# Patient Record
Sex: Male | Born: 1935 | Race: White | Hispanic: No | Marital: Married | Smoking: Former smoker
Health system: Southern US, Community
[De-identification: ages and names within clinical notes are randomized; demographics above are authoritative.]

## PROBLEM LIST (undated history)

## (undated) DIAGNOSIS — I10 Essential (primary) hypertension: Secondary | ICD-10-CM

## (undated) DIAGNOSIS — F419 Anxiety disorder, unspecified: Secondary | ICD-10-CM

## (undated) DIAGNOSIS — K5792 Diverticulitis of intestine, part unspecified, without perforation or abscess without bleeding: Secondary | ICD-10-CM

## (undated) DIAGNOSIS — M1990 Unspecified osteoarthritis, unspecified site: Secondary | ICD-10-CM

## (undated) DIAGNOSIS — R0602 Shortness of breath: Secondary | ICD-10-CM

## (undated) DIAGNOSIS — I6529 Occlusion and stenosis of unspecified carotid artery: Secondary | ICD-10-CM

## (undated) DIAGNOSIS — C801 Malignant (primary) neoplasm, unspecified: Secondary | ICD-10-CM

## (undated) DIAGNOSIS — I350 Nonrheumatic aortic (valve) stenosis: Secondary | ICD-10-CM

## (undated) DIAGNOSIS — T7840XA Allergy, unspecified, initial encounter: Secondary | ICD-10-CM

## (undated) DIAGNOSIS — Z860100 Personal history of colon polyps, unspecified: Secondary | ICD-10-CM

## (undated) DIAGNOSIS — E785 Hyperlipidemia, unspecified: Secondary | ICD-10-CM

## (undated) DIAGNOSIS — J8410 Pulmonary fibrosis, unspecified: Secondary | ICD-10-CM

## (undated) DIAGNOSIS — K219 Gastro-esophageal reflux disease without esophagitis: Secondary | ICD-10-CM

## (undated) HISTORY — PX: ANOMALOUS PULMONARY VENOUS RETURN REPAIR: SHX255

## (undated) HISTORY — DX: Anxiety disorder, unspecified: F41.9

## (undated) HISTORY — DX: Nonrheumatic aortic (valve) stenosis: I35.0

## (undated) HISTORY — DX: Occlusion and stenosis of unspecified carotid artery: I65.29

## (undated) HISTORY — DX: Hyperlipidemia, unspecified: E78.5

## (undated) HISTORY — DX: Gastro-esophageal reflux disease without esophagitis: K21.9

## (undated) HISTORY — DX: Diverticulitis of intestine, part unspecified, without perforation or abscess without bleeding: K57.92

## (undated) HISTORY — DX: Allergy, unspecified, initial encounter: T78.40XA

## (undated) HISTORY — DX: Pulmonary fibrosis, unspecified: J84.10

## (undated) HISTORY — DX: Essential (primary) hypertension: I10

## (undated) HISTORY — DX: Personal history of colonic polyps: Z86.010

## (undated) HISTORY — PX: CARDIAC CATHETERIZATION: SHX172

## (undated) HISTORY — DX: Unspecified osteoarthritis, unspecified site: M19.90

## (undated) HISTORY — PX: MELANOMA EXCISION: SHX5266

## (undated) HISTORY — DX: Personal history of colon polyps, unspecified: Z86.0100

---

## 1998-05-06 ENCOUNTER — Inpatient Hospital Stay (HOSPITAL_COMMUNITY): Admission: EM | Admit: 1998-05-06 | Discharge: 1998-05-06 | Payer: Self-pay | Admitting: Neurosurgery

## 2005-10-20 ENCOUNTER — Ambulatory Visit: Payer: Self-pay | Admitting: Family Medicine

## 2005-10-21 ENCOUNTER — Ambulatory Visit: Payer: Self-pay | Admitting: Family Medicine

## 2005-12-01 ENCOUNTER — Ambulatory Visit: Payer: Self-pay | Admitting: Family Medicine

## 2006-06-24 ENCOUNTER — Ambulatory Visit: Payer: Self-pay | Admitting: Internal Medicine

## 2006-07-25 ENCOUNTER — Ambulatory Visit: Payer: Self-pay | Admitting: Internal Medicine

## 2006-10-24 ENCOUNTER — Ambulatory Visit: Payer: Self-pay | Admitting: Internal Medicine

## 2006-11-22 ENCOUNTER — Ambulatory Visit: Payer: Self-pay | Admitting: Internal Medicine

## 2006-11-24 ENCOUNTER — Ambulatory Visit: Payer: Self-pay | Admitting: Cardiology

## 2006-12-06 ENCOUNTER — Encounter: Payer: Self-pay | Admitting: Internal Medicine

## 2006-12-06 ENCOUNTER — Ambulatory Visit: Payer: Self-pay | Admitting: Internal Medicine

## 2006-12-06 LAB — CONVERTED CEMR LAB
Basophils Absolute: 0 10*3/uL (ref 0.0–0.1)
Basophils Relative: 0.6 % (ref 0.0–1.0)
Eosinophils Absolute: 0.3 10*3/uL (ref 0.0–0.6)
Eosinophils Relative: 4.5 % (ref 0.0–5.0)
Hemoglobin: 11.8 g/dL — ABNORMAL LOW (ref 13.0–17.0)
MCV: 71 fL — ABNORMAL LOW (ref 78.0–100.0)
Monocytes Absolute: 0.8 10*3/uL — ABNORMAL HIGH (ref 0.2–0.7)
Monocytes Relative: 10.7 % (ref 3.0–11.0)
Platelets: 326 10*3/uL (ref 150–400)
Pro B Natriuretic peptide (BNP): 288 pg/mL — ABNORMAL HIGH (ref 0.0–100.0)
RBC: 5.02 M/uL (ref 4.22–5.81)
Sed Rate: 19 mm/hr (ref 0–20)
WBC: 7.4 10*3/uL (ref 4.5–10.5)

## 2007-01-11 ENCOUNTER — Ambulatory Visit: Payer: Self-pay | Admitting: Internal Medicine

## 2007-01-13 ENCOUNTER — Ambulatory Visit: Payer: Self-pay | Admitting: Family Medicine

## 2007-01-25 ENCOUNTER — Ambulatory Visit: Payer: Self-pay | Admitting: Internal Medicine

## 2007-01-25 LAB — CONVERTED CEMR LAB
Albumin: 3.6 g/dL (ref 3.5–5.2)
Basophils Absolute: 0 10*3/uL (ref 0.0–0.1)
Cholesterol: 128 mg/dL (ref 0–200)
Eosinophils Absolute: 0.3 10*3/uL (ref 0.0–0.6)
GFR calc Af Amer: 143 mL/min
GFR calc non Af Amer: 118 mL/min
Glucose, Bld: 92 mg/dL (ref 70–99)
HCT: 36.2 % — ABNORMAL LOW (ref 39.0–52.0)
Hemoglobin: 12.3 g/dL — ABNORMAL LOW (ref 13.0–17.0)
MCHC: 34.1 g/dL (ref 30.0–36.0)
MCV: 71.6 fL — ABNORMAL LOW (ref 78.0–100.0)
Monocytes Absolute: 0.6 10*3/uL (ref 0.2–0.7)
Monocytes Relative: 10.5 % (ref 3.0–11.0)
Neutro Abs: 3.2 10*3/uL (ref 1.4–7.7)
Neutrophils Relative %: 53.1 % (ref 43.0–77.0)
Phosphorus: 3.8 mg/dL (ref 2.3–4.6)
Potassium: 4.3 meq/L (ref 3.5–5.1)
RDW: 17.4 % — ABNORMAL HIGH (ref 11.5–14.6)
Saturation Ratios: 6.3 % — ABNORMAL LOW (ref 20.0–50.0)
Sodium: 142 meq/L (ref 135–145)
Total CHOL/HDL Ratio: 4.7

## 2007-02-28 ENCOUNTER — Ambulatory Visit: Payer: Self-pay | Admitting: Gastroenterology

## 2007-02-28 LAB — CONVERTED CEMR LAB
IgA: 219 mg/dL (ref 68–378)
Tissue Transglutaminase Ab, IgA: <3 units (ref <5)

## 2007-03-01 ENCOUNTER — Ambulatory Visit: Payer: Self-pay | Admitting: Internal Medicine

## 2007-03-08 ENCOUNTER — Encounter: Payer: Self-pay | Admitting: Gastroenterology

## 2007-03-08 ENCOUNTER — Ambulatory Visit: Payer: Self-pay | Admitting: Gastroenterology

## 2007-03-08 LAB — HM COLONOSCOPY

## 2007-04-06 DIAGNOSIS — J309 Allergic rhinitis, unspecified: Secondary | ICD-10-CM | POA: Insufficient documentation

## 2007-04-06 DIAGNOSIS — I359 Nonrheumatic aortic valve disorder, unspecified: Secondary | ICD-10-CM | POA: Insufficient documentation

## 2007-04-06 DIAGNOSIS — I10 Essential (primary) hypertension: Secondary | ICD-10-CM | POA: Insufficient documentation

## 2007-04-06 DIAGNOSIS — K5730 Diverticulosis of large intestine without perforation or abscess without bleeding: Secondary | ICD-10-CM | POA: Insufficient documentation

## 2007-04-06 DIAGNOSIS — J8410 Pulmonary fibrosis, unspecified: Secondary | ICD-10-CM

## 2007-04-06 DIAGNOSIS — Z860100 Personal history of colon polyps, unspecified: Secondary | ICD-10-CM | POA: Insufficient documentation

## 2007-04-06 DIAGNOSIS — M1990 Unspecified osteoarthritis, unspecified site: Secondary | ICD-10-CM | POA: Insufficient documentation

## 2007-04-06 DIAGNOSIS — M109 Gout, unspecified: Secondary | ICD-10-CM

## 2007-04-08 DIAGNOSIS — E785 Hyperlipidemia, unspecified: Secondary | ICD-10-CM

## 2007-04-08 DIAGNOSIS — K219 Gastro-esophageal reflux disease without esophagitis: Secondary | ICD-10-CM

## 2007-04-27 ENCOUNTER — Ambulatory Visit: Payer: Self-pay | Admitting: Internal Medicine

## 2007-04-27 DIAGNOSIS — D649 Anemia, unspecified: Secondary | ICD-10-CM

## 2007-05-16 ENCOUNTER — Encounter (INDEPENDENT_AMBULATORY_CARE_PROVIDER_SITE_OTHER): Payer: Self-pay | Admitting: *Deleted

## 2007-10-24 ENCOUNTER — Telehealth (INDEPENDENT_AMBULATORY_CARE_PROVIDER_SITE_OTHER): Payer: Self-pay | Admitting: *Deleted

## 2007-11-10 ENCOUNTER — Ambulatory Visit: Payer: Self-pay | Admitting: Internal Medicine

## 2007-11-10 LAB — CONVERTED CEMR LAB
Basophils Relative: 0.3 % (ref 0.0–1.0)
Eosinophils Relative: 4.2 % (ref 0.0–5.0)
Hemoglobin: 13.4 g/dL (ref 13.0–17.0)
Lymphocytes Relative: 30.9 % (ref 12.0–46.0)
Monocytes Absolute: 0.6 10*3/uL (ref 0.2–0.7)
Monocytes Relative: 9.1 % (ref 3.0–11.0)
Neutro Abs: 3.6 10*3/uL (ref 1.4–7.7)
RDW: 19.3 % — ABNORMAL HIGH (ref 11.5–14.6)
WBC: 6.5 10*3/uL (ref 4.5–10.5)

## 2007-11-13 LAB — CONVERTED CEMR LAB
ALT: 15 units/L (ref 0–53)
AST: 17 units/L (ref 0–37)
Alkaline Phosphatase: 43 units/L (ref 39–117)
BUN: 14 mg/dL (ref 6–23)
Basophils Relative: 1 % (ref 0–1)
Bilirubin, Direct: 0.1 mg/dL (ref 0.0–0.3)
Calcium: 8.7 mg/dL (ref 8.4–10.5)
Cholesterol: 155 mg/dL (ref 0–200)
Eosinophils Absolute: 0.4 10*3/uL (ref 0.0–0.7)
Eosinophils Relative: 6 % — ABNORMAL HIGH (ref 0–5)
Glucose, Bld: 76 mg/dL (ref 70–99)
HCT: 41.3 % (ref 39.0–52.0)
Indirect Bilirubin: 0.5 mg/dL (ref 0.0–0.9)
Lymphs Abs: 2.2 10*3/uL (ref 0.7–4.0)
MCHC: 32.4 g/dL (ref 30.0–36.0)
MCV: 85.5 fL (ref 78.0–100.0)
Monocytes Relative: 8 % (ref 3–12)
Platelets: 220 10*3/uL (ref 150–400)
RBC: 4.83 M/uL (ref 4.22–5.81)
Total Protein: 7.3 g/dL (ref 6.0–8.3)
Triglycerides: 298 mg/dL — ABNORMAL HIGH (ref <150)
WBC: 6.1 10*3/uL (ref 4.0–10.5)

## 2007-11-20 ENCOUNTER — Telehealth: Payer: Self-pay | Admitting: Internal Medicine

## 2007-11-24 ENCOUNTER — Ambulatory Visit: Payer: Self-pay | Admitting: Internal Medicine

## 2007-12-06 ENCOUNTER — Encounter: Payer: Self-pay | Admitting: Internal Medicine

## 2008-01-02 ENCOUNTER — Telehealth (INDEPENDENT_AMBULATORY_CARE_PROVIDER_SITE_OTHER): Payer: Self-pay | Admitting: *Deleted

## 2008-01-04 ENCOUNTER — Ambulatory Visit: Payer: Self-pay | Admitting: Internal Medicine

## 2008-02-13 ENCOUNTER — Ambulatory Visit: Payer: Self-pay | Admitting: Internal Medicine

## 2008-03-04 ENCOUNTER — Ambulatory Visit: Payer: Self-pay | Admitting: Internal Medicine

## 2008-03-15 ENCOUNTER — Encounter: Payer: Self-pay | Admitting: Internal Medicine

## 2008-03-18 ENCOUNTER — Telehealth (INDEPENDENT_AMBULATORY_CARE_PROVIDER_SITE_OTHER): Payer: Self-pay | Admitting: *Deleted

## 2008-03-25 ENCOUNTER — Telehealth (INDEPENDENT_AMBULATORY_CARE_PROVIDER_SITE_OTHER): Payer: Self-pay | Admitting: *Deleted

## 2008-06-18 ENCOUNTER — Ambulatory Visit: Payer: Self-pay | Admitting: Internal Medicine

## 2008-06-19 LAB — CONVERTED CEMR LAB
ALT: 20 units/L (ref 0–53)
AST: 23 units/L (ref 0–37)
BUN: 14 mg/dL (ref 6–23)
Calcium: 9.2 mg/dL (ref 8.4–10.5)
Direct LDL: 94.9 mg/dL
Eosinophils Relative: 5.2 % — ABNORMAL HIGH (ref 0.0–5.0)
Glucose, Bld: 86 mg/dL (ref 70–99)
Hemoglobin: 13.3 g/dL (ref 13.0–17.0)
Lymphocytes Relative: 31.5 % (ref 12.0–46.0)
Monocytes Relative: 9.5 % (ref 3.0–12.0)
Neutro Abs: 2.6 10*3/uL (ref 1.4–7.7)
Phosphorus: 4.2 mg/dL (ref 2.3–4.6)
RBC: 4.58 M/uL (ref 4.22–5.81)
RDW: 14.5 % (ref 11.5–14.6)
Sodium: 141 meq/L (ref 135–145)
Total Bilirubin: 0.8 mg/dL (ref 0.3–1.2)
Total CHOL/HDL Ratio: 9.8
Total Protein: 7.5 g/dL (ref 6.0–8.3)
VLDL: 55 mg/dL — ABNORMAL HIGH (ref 0–40)
WBC: 5 10*3/uL (ref 4.5–10.5)

## 2008-06-28 ENCOUNTER — Telehealth: Payer: Self-pay | Admitting: Internal Medicine

## 2008-07-17 ENCOUNTER — Telehealth: Payer: Self-pay | Admitting: Internal Medicine

## 2008-08-23 ENCOUNTER — Ambulatory Visit: Payer: Self-pay | Admitting: Internal Medicine

## 2008-10-22 ENCOUNTER — Telehealth: Payer: Self-pay | Admitting: Internal Medicine

## 2008-10-28 ENCOUNTER — Ambulatory Visit: Payer: Self-pay | Admitting: Internal Medicine

## 2008-10-31 ENCOUNTER — Telehealth: Payer: Self-pay | Admitting: Internal Medicine

## 2008-11-15 ENCOUNTER — Telehealth: Payer: Self-pay | Admitting: Internal Medicine

## 2008-11-26 ENCOUNTER — Telehealth: Payer: Self-pay | Admitting: Internal Medicine

## 2008-12-11 ENCOUNTER — Encounter: Payer: Self-pay | Admitting: Internal Medicine

## 2009-01-08 ENCOUNTER — Telehealth: Payer: Self-pay | Admitting: Internal Medicine

## 2009-01-28 ENCOUNTER — Encounter: Payer: Self-pay | Admitting: Internal Medicine

## 2009-02-12 ENCOUNTER — Telehealth: Payer: Self-pay | Admitting: Internal Medicine

## 2009-02-18 ENCOUNTER — Telehealth: Payer: Self-pay | Admitting: Internal Medicine

## 2009-02-26 ENCOUNTER — Ambulatory Visit: Payer: Self-pay | Admitting: Internal Medicine

## 2009-02-27 LAB — CONVERTED CEMR LAB
ALT: 23 units/L (ref 0–53)
Albumin: 3.7 g/dL (ref 3.5–5.2)
Alkaline Phosphatase: 44 units/L (ref 39–117)
BUN: 14 mg/dL (ref 6–23)
Basophils Absolute: 0 10*3/uL (ref 0.0–0.1)
Bilirubin, Direct: 0.1 mg/dL (ref 0.0–0.3)
CO2: 33 meq/L — ABNORMAL HIGH (ref 19–32)
Chloride: 108 meq/L (ref 96–112)
HCT: 39.2 % (ref 39.0–52.0)
Lymphs Abs: 1.4 10*3/uL (ref 0.7–4.0)
MCV: 84.9 fL (ref 78.0–100.0)
Monocytes Absolute: 0.3 10*3/uL (ref 0.1–1.0)
Platelets: 192 10*3/uL (ref 150.0–400.0)
RDW: 14.8 % — ABNORMAL HIGH (ref 11.5–14.6)
TSH: 1.29 microintl units/mL (ref 0.35–5.50)
Total Protein: 7.5 g/dL (ref 6.0–8.3)

## 2009-03-28 ENCOUNTER — Telehealth: Payer: Self-pay | Admitting: Family Medicine

## 2009-04-29 ENCOUNTER — Telehealth: Payer: Self-pay | Admitting: Internal Medicine

## 2009-07-09 ENCOUNTER — Ambulatory Visit: Payer: Self-pay | Admitting: Internal Medicine

## 2009-07-09 DIAGNOSIS — G589 Mononeuropathy, unspecified: Secondary | ICD-10-CM | POA: Insufficient documentation

## 2009-08-20 ENCOUNTER — Telehealth: Payer: Self-pay | Admitting: Internal Medicine

## 2009-09-23 ENCOUNTER — Telehealth: Payer: Self-pay | Admitting: Internal Medicine

## 2009-10-21 ENCOUNTER — Telehealth: Payer: Self-pay | Admitting: Internal Medicine

## 2009-11-06 ENCOUNTER — Ambulatory Visit: Payer: Self-pay | Admitting: Internal Medicine

## 2009-11-10 ENCOUNTER — Telehealth: Payer: Self-pay | Admitting: Internal Medicine

## 2009-11-10 LAB — CONVERTED CEMR LAB
ALT: 22 units/L (ref 0–53)
Basophils Relative: 0.4 % (ref 0.0–3.0)
Bilirubin, Direct: 0.1 mg/dL (ref 0.0–0.3)
CO2: 32 meq/L (ref 19–32)
Calcium: 8.9 mg/dL (ref 8.4–10.5)
Chloride: 104 meq/L (ref 96–112)
Eosinophils Absolute: 0.3 10*3/uL (ref 0.0–0.7)
Glucose, Bld: 111 mg/dL — ABNORMAL HIGH (ref 70–99)
HCT: 39.2 % (ref 39.0–52.0)
HDL: 21.1 mg/dL — ABNORMAL LOW (ref >39.00)
Hemoglobin: 12.8 g/dL — ABNORMAL LOW (ref 13.0–17.0)
Lymphs Abs: 1.6 10*3/uL (ref 0.7–4.0)
MCHC: 32.6 g/dL (ref 30.0–36.0)
MCV: 87.3 fL (ref 78.0–100.0)
Monocytes Absolute: 0.4 10*3/uL (ref 0.1–1.0)
Neutro Abs: 2.4 10*3/uL (ref 1.4–7.7)
Neutrophils Relative %: 48.8 % (ref 43.0–77.0)
RBC: 4.5 M/uL (ref 4.22–5.81)
Sodium: 141 meq/L (ref 135–145)
Total Bilirubin: 0.8 mg/dL (ref 0.3–1.2)

## 2009-12-10 ENCOUNTER — Ambulatory Visit: Payer: Self-pay | Admitting: Family Medicine

## 2009-12-16 ENCOUNTER — Ambulatory Visit: Payer: Self-pay | Admitting: Internal Medicine

## 2010-01-13 ENCOUNTER — Telehealth: Payer: Self-pay | Admitting: Internal Medicine

## 2010-03-09 ENCOUNTER — Ambulatory Visit: Payer: Self-pay | Admitting: Internal Medicine

## 2010-03-09 ENCOUNTER — Telehealth: Payer: Self-pay | Admitting: Internal Medicine

## 2010-04-02 ENCOUNTER — Telehealth: Payer: Self-pay | Admitting: Internal Medicine

## 2010-04-10 ENCOUNTER — Telehealth: Payer: Self-pay | Admitting: Internal Medicine

## 2010-04-24 ENCOUNTER — Ambulatory Visit: Payer: Self-pay | Admitting: Internal Medicine

## 2010-05-12 ENCOUNTER — Telehealth: Payer: Self-pay | Admitting: Internal Medicine

## 2010-05-13 ENCOUNTER — Encounter: Payer: Self-pay | Admitting: Internal Medicine

## 2010-06-02 ENCOUNTER — Ambulatory Visit: Payer: Self-pay | Admitting: Internal Medicine

## 2010-07-10 ENCOUNTER — Ambulatory Visit: Payer: Self-pay | Admitting: Internal Medicine

## 2010-07-13 LAB — CONVERTED CEMR LAB
ALT: 16 units/L (ref 0–53)
AST: 20 units/L (ref 0–37)
BUN: 13 mg/dL (ref 6–23)
Basophils Absolute: 0 10*3/uL (ref 0.0–0.1)
Calcium: 9.3 mg/dL (ref 8.4–10.5)
Creatinine, Ser: 0.87 mg/dL (ref 0.40–1.50)
Eosinophils Absolute: 0.3 10*3/uL (ref 0.0–0.7)
Eosinophils Relative: 6 % — ABNORMAL HIGH (ref 0–5)
HCT: 37.9 % — ABNORMAL LOW (ref 39.0–52.0)
Hemoglobin: 12.4 g/dL — ABNORMAL LOW (ref 13.0–17.0)
MCHC: 32.7 g/dL (ref 30.0–36.0)
MCV: 83.5 fL (ref 78.0–100.0)
Monocytes Absolute: 0.4 10*3/uL (ref 0.1–1.0)
Platelets: 202 10*3/uL (ref 150–400)
Pro B Natriuretic peptide (BNP): 79.2 pg/mL (ref 0.0–100.0)
RDW: 16.1 % — ABNORMAL HIGH (ref 11.5–15.5)
Sed Rate: 21 mm/hr — ABNORMAL HIGH (ref 0–16)
Total Bilirubin: 0.4 mg/dL (ref 0.3–1.2)

## 2010-07-16 ENCOUNTER — Telehealth (INDEPENDENT_AMBULATORY_CARE_PROVIDER_SITE_OTHER): Payer: Self-pay | Admitting: *Deleted

## 2010-07-20 ENCOUNTER — Ambulatory Visit: Payer: Self-pay | Admitting: Internal Medicine

## 2010-07-20 DIAGNOSIS — J9610 Chronic respiratory failure, unspecified whether with hypoxia or hypercapnia: Secondary | ICD-10-CM | POA: Insufficient documentation

## 2010-08-28 ENCOUNTER — Telehealth: Payer: Self-pay | Admitting: Internal Medicine

## 2010-08-31 ENCOUNTER — Ambulatory Visit: Payer: Self-pay | Admitting: Internal Medicine

## 2010-09-07 ENCOUNTER — Encounter: Payer: Self-pay | Admitting: Internal Medicine

## 2010-09-24 ENCOUNTER — Encounter: Payer: Self-pay | Admitting: Internal Medicine

## 2010-10-15 ENCOUNTER — Telehealth: Payer: Self-pay | Admitting: Internal Medicine

## 2010-11-04 ENCOUNTER — Ambulatory Visit
Admission: RE | Admit: 2010-11-04 | Discharge: 2010-11-04 | Payer: Self-pay | Source: Home / Self Care | Attending: Internal Medicine | Admitting: Internal Medicine

## 2010-11-04 ENCOUNTER — Other Ambulatory Visit: Payer: Self-pay | Admitting: Internal Medicine

## 2010-11-04 LAB — CBC WITH DIFFERENTIAL/PLATELET
Basophils Absolute: 0 10*3/uL (ref 0.0–0.1)
Basophils Relative: 0.4 % (ref 0.0–3.0)
Eosinophils Absolute: 0.3 10*3/uL (ref 0.0–0.7)
Eosinophils Relative: 4.7 % (ref 0.0–5.0)
HCT: 37.1 % — ABNORMAL LOW (ref 39.0–52.0)
Hemoglobin: 12.5 g/dL — ABNORMAL LOW (ref 13.0–17.0)
Lymphocytes Relative: 27.4 % (ref 12.0–46.0)
Lymphs Abs: 1.7 10*3/uL (ref 0.7–4.0)
MCHC: 33.8 g/dL (ref 30.0–36.0)
MCV: 83.9 fl (ref 78.0–100.0)
Monocytes Absolute: 0.4 10*3/uL (ref 0.1–1.0)
Monocytes Relative: 6.8 % (ref 3.0–12.0)
Neutro Abs: 3.9 10*3/uL (ref 1.4–7.7)
Neutrophils Relative %: 60.7 % (ref 43.0–77.0)
Platelets: 264 10*3/uL (ref 150.0–400.0)
RBC: 4.42 Mil/uL (ref 4.22–5.81)
RDW: 15.4 % — ABNORMAL HIGH (ref 11.5–14.6)
WBC: 6.4 10*3/uL (ref 4.5–10.5)

## 2010-11-04 LAB — BASIC METABOLIC PANEL
BUN: 14 mg/dL (ref 6–23)
CO2: 35 mEq/L — ABNORMAL HIGH (ref 19–32)
Calcium: 9.2 mg/dL (ref 8.4–10.5)
Chloride: 101 mEq/L (ref 96–112)
Creatinine, Ser: 0.9 mg/dL (ref 0.4–1.5)
GFR: 83.25 mL/min (ref >60.00)
Glucose, Bld: 91 mg/dL (ref 70–99)
Potassium: 4.7 mEq/L (ref 3.5–5.1)
Sodium: 141 mEq/L (ref 135–145)

## 2010-11-04 LAB — IBC PANEL
Iron: 44 ug/dL (ref 42–165)
Saturation Ratios: 10.6 % — ABNORMAL LOW (ref 20.0–50.0)
Transferrin: 295.2 mg/dL (ref 212.0–360.0)

## 2010-11-09 ENCOUNTER — Telehealth: Payer: Self-pay | Admitting: Internal Medicine

## 2010-11-11 ENCOUNTER — Encounter: Payer: Self-pay | Admitting: Internal Medicine

## 2010-11-12 ENCOUNTER — Ambulatory Visit
Admission: RE | Admit: 2010-11-12 | Discharge: 2010-11-12 | Payer: Self-pay | Source: Home / Self Care | Attending: Internal Medicine | Admitting: Internal Medicine

## 2010-11-12 ENCOUNTER — Other Ambulatory Visit: Payer: Self-pay | Admitting: Internal Medicine

## 2010-11-13 LAB — LIPID PANEL
Cholesterol: 173 mg/dL (ref 0–200)
HDL: 30.2 mg/dL — ABNORMAL LOW (ref >39.00)
Total CHOL/HDL Ratio: 6
Triglycerides: 222 mg/dL — ABNORMAL HIGH (ref 0.0–149.0)
VLDL: 44.4 mg/dL — ABNORMAL HIGH (ref 0.0–40.0)

## 2010-11-13 LAB — CBC WITH DIFFERENTIAL/PLATELET
Basophils Absolute: 0 10*3/uL (ref 0.0–0.1)
Basophils Relative: 0.5 % (ref 0.0–3.0)
Eosinophils Absolute: 0.4 10*3/uL (ref 0.0–0.7)
Eosinophils Relative: 6.9 % — ABNORMAL HIGH (ref 0.0–5.0)
HCT: 38.4 % — ABNORMAL LOW (ref 39.0–52.0)
Hemoglobin: 12.8 g/dL — ABNORMAL LOW (ref 13.0–17.0)
Lymphocytes Relative: 34.7 % (ref 12.0–46.0)
Lymphs Abs: 2.1 10*3/uL (ref 0.7–4.0)
MCHC: 33.3 g/dL (ref 30.0–36.0)
MCV: 83.8 fl (ref 78.0–100.0)
Monocytes Absolute: 0.4 10*3/uL (ref 0.1–1.0)
Monocytes Relative: 6.7 % (ref 3.0–12.0)
Neutro Abs: 3.1 10*3/uL (ref 1.4–7.7)
Neutrophils Relative %: 51.2 % (ref 43.0–77.0)
Platelets: 239 10*3/uL (ref 150.0–400.0)
RBC: 4.59 Mil/uL (ref 4.22–5.81)
RDW: 15.6 % — ABNORMAL HIGH (ref 11.5–14.6)
WBC: 6 10*3/uL (ref 4.5–10.5)

## 2010-11-13 LAB — HEPATIC FUNCTION PANEL
ALT: 15 U/L (ref 0–53)
AST: 21 U/L (ref 0–37)
Albumin: 3.8 g/dL (ref 3.5–5.2)
Alkaline Phosphatase: 53 U/L (ref 39–117)
Bilirubin, Direct: 0.1 mg/dL (ref 0.0–0.3)
Total Bilirubin: 0.5 mg/dL (ref 0.3–1.2)
Total Protein: 8.4 g/dL — ABNORMAL HIGH (ref 6.0–8.3)

## 2010-11-13 LAB — RENAL FUNCTION PANEL
Albumin: 3.8 g/dL (ref 3.5–5.2)
BUN: 13 mg/dL (ref 6–23)
CO2: 35 mEq/L — ABNORMAL HIGH (ref 19–32)
Calcium: 9.1 mg/dL (ref 8.4–10.5)
Chloride: 99 mEq/L (ref 96–112)
Creatinine, Ser: 0.8 mg/dL (ref 0.4–1.5)
GFR: 98.85 mL/min (ref >60.00)
Glucose, Bld: 92 mg/dL (ref 70–99)
Phosphorus: 4.3 mg/dL (ref 2.3–4.6)
Potassium: 4.3 mEq/L (ref 3.5–5.1)
Sodium: 141 mEq/L (ref 135–145)

## 2010-11-13 LAB — LDL CHOLESTEROL, DIRECT: Direct LDL: 121 mg/dL

## 2010-11-17 NOTE — Progress Notes (Signed)
Summary: Etodolac  Phone Note Refill Request Message from:  Fax from Pharmacy on Mar 09, 2010 11:11 AM  Refills Requested: Medication #1:  ETODOLAC 500 MG  TABS take 1 by mouth once daily as needed pain   Supply Requested: 3 months   Notes: With 4 refills [facility]  Fax:   [phone]   Requests 180 with 4 refills.   Method Requested: Electronic Initial call taken by: Delilah Shan CMA Duncan Dull),  Mar 09, 2010 11:12 AM  Follow-up for Phone Call        please check dose If he takes 1 daily, I think #90 is all they will give okay 4 refills Follow-up by: Cindee Salt MD,  Mar 09, 2010 1:44 PM  Additional Follow-up for Phone Call Additional follow up Details #1::        pt has appt today, will ask pt then. Additional Follow-up by: Mervin Hack CMA Duncan Dull),  Mar 09, 2010 3:19 PM

## 2010-11-17 NOTE — Assessment & Plan Note (Signed)
Summary: EVALUATE FOR OXYGEN   Vital Signs:  Patient profile:   77 year old male Weight:      234 pounds O2 Sat:      83 % on Room air Temp:     98.4 degrees F oral Pulse rate:   80 / minute Pulse rhythm:   regular Resp:     20 per minute BP sitting:   140 / 62  (right arm) Cuff size:   regular  Vitals Entered By: Lowella Petties CMA (April 24, 2010 11:30 AM)  O2 Flow:  Room air CC: Evaluate need for oxygen   History of Present Illness: Was in [place] Altitude was 6500 ft Noted sig SOB---was seen there and noted 02 sat of 74% Has had worsening SOB recently  some increase in chronic cough generally dry has sig DOE---has to stop after walking now. Has gotten up to 1.5 miles in mall before trip--now much worse  much better with the oxygen had to send the portable unit back to [place]  Allergies: 1)  ! Penicillin 2)  ! Sulfa  Past History:  Past medical, surgical, family and social histories (including risk factors) reviewed for relevance to current acute and chronic problems.  Past Medical History: Reviewed history from 07/09/2009 and no changes required. Allergic rhinitis Colonic polyps, hx of--tubular adenoma Diverticulosis, colon Gout Hypertension Osteoarthritis--hands, hips Pulmonary fibrosis-----------------------------------------Dr Wert   Hyperlipidemia GERD Aortic stenosis---------------------------------------------Dr Gwen Pounds   Carotid stenosis  Past Surgical History: Reviewed history from 08/23/2008 and no changes required. AVR (BOVINE ) [facility]  03/2004 CARDIAC CATH-- NO SIG. 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 Dad ided @87  lung disease, ?MI Mom died @58  ?valve disease, thyroid 1 sister with breast cancer No HTN DM in Mom, mat GM No prostate or colon cancer Dad had melanoma  Social  History: Reviewed history from 11/24/2007 and no changes required. Marital Status: Married Children: 1 Daughter in [place] Retired Tax inspector organist Former Smoker quit in [phone]  with no respiratory complaint Alcohol use-yes  Review of Systems       appetite was poor until he started on the oxygen weight stable not sleeping well without the oxygen--slept fine while on it no AM headaches  Physical Exam  General:  alert.  NAD Neck:  supple, no masses, and no thyromegaly.   Lungs:  normal respiratory effort, no intercostal retractions, and no accessory muscle use.  Dry crackles up 1/3rd bilaterally Heart:  normal rate, regular rhythm, no murmur, and no gallop.  Occ skips Extremities:  trace edema bilaterally   Impression & Recommendations:  Problem # 1:  PULMONARY FIBROSIS (ICD-515) Assessment Deteriorated  with sig hypoxia now needs continous oxygen at 2 liters per minute 02 sat at rest in stable state is 83% on room air  Orders: DME Referral (DME)  Complete Medication List: 1)  Prilosec 20 Mg Cpdr (Omeprazole) .... Take 1 tablet by mouth two times a day 2)  Zyrtec 10 Mg Tabs (Cetirizine hcl) .... Take 1 tablet by mouth once a day 3)  Adult Aspirin Low Strength 81 Mg Tbdp (Aspirin) .... Take 1 tablet by mouth once a day 4)  Etodolac 500 Mg Tabs (Etodolac) .... Take 1 by mouth once daily 5)  Pravastatin Sodium 40 Mg Tabs (Pravastatin sodium) .... Take one by mouth daily 6)  Colchicine 0.6 Mg Tabs (Colchicine) .Marland KitchenMarland KitchenMarland Kitchen  Take 1 tablet by mouth once daily to prevent gout 7)  Tramadol Hcl 50 Mg Tabs (Tramadol hcl) .... Take 1 tablet by mouth up to three times a day as needed for pain 8)  Bisoprolol-hydrochlorothiazide 5-6.25 Mg Tabs (Bisoprolol-hydrochlorothiazide) .... Take 1/2 tablet by mouth once daily 9)  Fish Oil Concentrate 1000 Mg Caps (Omega-3 fatty acids) .Marland Kitchen.. 1 daily by mouth 10)  Vitamin D3 1000 Unit Tabs (Cholecalciferol) .... Take 1 by mouth  once daily 11)  Vitamin B-12 1000 Mcg Tabs (Cyanocobalamin) .... Take 1 by mouth once daily 12)  Multivitamins Tabs (Multiple vitamin) .... Take 1 tablet by mouth once a day 13)  Lutein Tabs (Lutein tabs) .... Take 1 tablet by mouth once a day 14)  Cvs Vitamin E 400 Unit Caps (Vitamin e) .Marland Kitchen.. 1 daily by mouth 15)  Coq-10 50 Mg Caps (Coenzyme q10) .Marland Kitchen.. 1 daily by mouth  Patient Instructions: 1)  Please keep September 23rd appt 2)  Please set up the home oxygen

## 2010-11-17 NOTE — Progress Notes (Signed)
Summary: TRAMADOL  Phone Note Refill Request Message [id number] on January 13, 2010 9:59 AM  Refills Requested: Medication #1:  TRAMADOL HCL 50 MG TABS take 1 tablet by mouth two times a day E-Scribe Request, should patient be taking two times a day ? or three times a day ? quantity is 90. Please advise.   Method Requested: Electronic Initial call taken by: Mervin Hack CMA Duncan Dull),  January 13, 2010 10:00 AM  Follow-up for Phone Call        I adjusted Okay #90 x 1 Follow-up by: Cindee Salt MD,  January 13, 2010 11:29 AM  Additional Follow-up for Phone Call Additional follow up Details #1::        Rx faxed to pharmacy Additional Follow-up by: DeShannon Smith CMA Duncan Dull),  January 13, 2010 11:33 AM    New/Updated Medications: TRAMADOL HCL 50 MG TABS (TRAMADOL HCL) take 1 tablet by mouth up to three times a day as needed for pain Prescriptions: TRAMADOL HCL 50 MG TABS (TRAMADOL HCL) take 1 tablet by mouth up to three times a day as needed for pain  #90 x 1   Entered by:   Mervin Hack CMA (AAMA)   Authorized by:   Cindee Salt MD   Signed by:   Mervin Hack CMA (AAMA) on 01/13/2010   Method used:   [facility]ronically to        [facility] [phone][facility] (retail)       [address] [place], [place]  [address]       Ph: [phone] or [phone]       Fax: [phone]   RxID:   [id number]

## 2010-11-17 NOTE — Progress Notes (Signed)
Summary: fyi for lab results  Phone Note Call from Patient   Caller: Patient Call For: wert Summary of Call: pt has already received lab results from dr Vassie Moselle  office Initial call taken by: Rickard Patience,  July 16, 2010 11:12 AM  Follow-up for Phone Call        called and spoke with pt.  pt states he received a call from our office regarding lab results.  Pt just wanted [place] to be aware that he already got these results from [name] office.  ( I don't see anywhere in Pt's chart where we called him about test results....Marland Kitchennothing further needed).  Aundra Millet Reynolds LPN  July 16, 2010 11:20 AM

## 2010-11-17 NOTE — Miscellaneous (Signed)
Summary: Oxygen Device Eval form/[facility]  Oxygen Device Eval form/[facility]   Imported By: Sherian Rein 09/16/2010 09:13:32  _____________________________________________________________________  External Attachment:    Type:   Image     Comment:   External Document

## 2010-11-17 NOTE — Progress Notes (Signed)
Summary: CLARITHROMYCIN  Phone Note Refill Request Message from:  Patient on October 21, 2009 4:43 PM  Refills Requested: Medication #1:  CLARITHROMYCIN 500 MG TABS 2 by mouth times one 1 hour before proceedure. pt states he had a growth removed from his head and he used the 2 CLARITHROMYCIN for that procedure, so now he needs a new rx for his dentist to replace the other, also instead of 2 pills he would like 4 just in case he needs another procedure for the growth that was removed. Pt know Dr. Alphonsus Sias will not be in until Monday.   Method Requested: Electronic Initial call taken by: Mervin Hack CMA Duncan Dull),  October 21, 2009 4:45 PM  Follow-up for Phone Call        okay to refill #4 x 0 I am only prescirbing for dentist though--I will not prescribe for another doctor's procedure and if he had infection from a procedure, he really needs to discuss that with the physician doing the procedure Follow-up by: Cindee Salt MD,  October 24, 2009 1:56 PM  Additional Follow-up for Phone Call Additional follow up Details #1::        spoke with patient and when we "denied new to follow" waiting on Dr. to approve, pt went to his dentist and the dentist refilled. I advised pt that I had to get dr. approval, he said he understood and just called his dentist. Mervin Hack CMA Duncan Dull)  October 27, 2009 9:06 AM   Probably better if dentist fills but my concern was if he had a skin infection after some procedure Additional Follow-up by: Cindee Salt MD,  October 27, 2009 1:23 PM    Prescriptions: CLARITHROMYCIN 500 MG TABS (CLARITHROMYCIN) 2 by mouth times one 1 hour before proceedure  #4 x 0   Entered by:   Mervin Hack CMA (AAMA)   Authorized by:   Cindee Salt MD   Signed by:   Mervin Hack CMA (AAMA) on 10/27/2009   Method used:   Electronically to        [facility] [phone][facility] (retail)       [address] [place], [place]  [address]  Ph: [phone] or [phone]       Fax: [phone]   RxID:   [id number]

## 2010-11-17 NOTE — Progress Notes (Signed)
Summary: ? diverticulitis  Phone Note Call from Patient Call back at [phone]   Caller: Patient Call For: Cindee Salt MD Summary of Call: pt thinks problem  with diverticulitis. Pt is having  loose BM with pain in left side (on pain scale of 1- 10 pt said pain is 5), started  late Sat..  No fever and no mucus in BM. Pt is eating toast and drinking liquids. Pt saw Dr. Alphonsus Sias on 11/06/09. [facility] [phone]. Please advise.  Initial call taken by: Lewanda Rife LPN,  November 10, 2009 9:27 AM  Follow-up for Phone Call        okay to try cipro 500mg  two times a day  #20 x 0 If pain worsens, or unable to eat, or sig pain or fever, he needs to be seen Follow-up by: Cindee Salt MD,  November 10, 2009 10:26 AM  Additional Follow-up for Phone Call Additional follow up Details #1::        Rx Called In, Spoke with patient and advised results.  Additional Follow-up by: Mervin Hack CMA Duncan Dull),  November 10, 2009 10:53 AM    New/Updated Medications: CIPROFLOXACIN HCL 500 MG TABS (CIPROFLOXACIN HCL) take 1 by mouth two times a day Prescriptions: CIPROFLOXACIN HCL 500 MG TABS (CIPROFLOXACIN HCL) take 1 by mouth two times a day  #20 x 0   Entered by:   Mervin Hack CMA (AAMA)   Authorized by:   Cindee Salt MD   Signed by:   Mervin Hack CMA (AAMA) on 11/10/2009   Method used:   Electronically to        [facility] [phone]* (retail)       [address] [place], [place]  [address]       Ph: [phone] or [phone]       Fax: [phone]   RxID:   [id number]

## 2010-11-17 NOTE — Assessment & Plan Note (Signed)
Summary: 4 m f/u dlo   Vital Signs:  Patient profile:   77 year old male Weight:      234 pounds O2 Sat:      91 % on Room air Temp:     98.4 degrees F oral Pulse rate:   72 / minute Pulse rhythm:   regular BP sitting:   110 / 60  (left arm) Cuff size:   large  Vitals Entered By: Mervin Hack CMA Duncan Dull) (July 10, 2010 2:10 PM)  O2 Flow:  Room air CC: 4 month follow-up   History of Present Illness: DOing fairly well Daughter and grandchildren had been with them for a while--now in their own place in [place]  Really notices a difference with the oxygen at night sleeps better and longer Not much difference during the day trying to exercise on treadmilll  Stable resp status Off the oxygen to shower and shave---occ spells off during the day (at rest) Still with regular cough--wife relates this (he states like it is mostly throat clearing but wife states it is deper)  No chest pain No dizziness or syncope---except occ mild orthostatic sensation if he gets up quick No palpitations  Still bothered by the neuropathy esp affects him playing the organ with his feet  Occ mild arthritic pain in low back or hands no meds in general  Allergies: 1)  ! Penicillin 2)  ! Sulfa  Past History:  Past medical, surgical, family and social histories (including risk factors) reviewed for relevance to current acute and chronic problems.  Past Medical History: Reviewed history from 06/02/2010 and no changes required. Allergic rhinitis Colonic polyps, hx of--tubular adenoma Diverticulosis, colon Gout Hypertension Osteoarthritis--hands, hips Pulmonary fibrosis-----------------------------------------Dr Wert       - PFT's 01/01/07 VC 56%  DLC0 42% not corrected for anemia     - 02 dep since June 2011 Hyperlipidemia GERD Aortic stenosis---------------------------------------------Dr Gwen Pounds   Carotid stenosis  Past Surgical History: Reviewed history from 08/23/2008 and no  changes required. AVR (BOVINE ) [facility]  03/2004 CARDIAC CATH-- NO SIG. 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 Dad ided @87  lung disease, ?MI Mom died @58  ?valve disease, thyroid 1 sister with breast cancer No HTN DM in Mom, mat GM No prostate or colon cancer Dad had melanoma  Social History: Reviewed history from 11/24/2007 and no changes required. Marital Status: Married Children: 1 Daughter in [place] Retired Tax inspector organist Former Smoker quit in [phone]  with no respiratory complaint Alcohol use-yes  Review of Systems       appetite is good weight fairly stable--down slightly from a while ago  Physical Exam  General:  alert and normal appearance.   Neck:  supple, no masses, no thyromegaly, no carotid bruits, and no cervical lymphadenopathy.   Lungs:  normal respiratory effort, no intercostal retractions, and no accessory muscle Coarse dry bibasilar crackles Heart:  normal rate, regular rhythm, and no gallop.  soft aortic systolic murmur and opening snap at LLSB Pulses:  1+ in feet Extremities:  no sig edema Psych:  normally interactive, good eye contact, not anxious appearing, and not depressed appearing.     Impression & Recommendations:  Problem # 1:  HYPERTENSION (ICD-401.9) Assessment Unchanged  good control no changes needed  His updated medication list for this problem includes:    Bisoprolol-hydrochlorothiazide 5-6.25 Mg Tabs (Bisoprolol-hydrochlorothiazide) .Marland KitchenMarland KitchenMarland KitchenMarland Kitchen  Take 1/2 tablet by mouth once daily  BP today: 110/60 Prior BP: 130/70 (06/02/2010)  Labs Reviewed: K+: 4.3 (11/06/2009) Creat: : 1.0 (11/06/2009)   Chol: 159 (11/06/2009)   HDL: 21.10 (11/06/2009)   LDL: DEL (06/18/2008)   TG: 226.0 (11/06/2009)  Orders: Venipuncture (11914) Specimen Handling  (99000) T-Comprehensive Metabolic Panel (78295-62130) T-CBC w/Diff (86578-46962)  Problem # 2:  PULMONARY FIBROSIS (ICD-515) Assessment: Unchanged  stable status better sleep on oxygen is quite striking  Orders: T-BNP  (B Natriuretic Peptide) (95284-13244) T-Sed Rate (Automated) (01027-25366)  Problem # 3:  AORTIC STENOSIS (ICD-424.1) Assessment: Unchanged no apparent symptoms  His updated medication list for this problem includes:    Bisoprolol-hydrochlorothiazide 5-6.25 Mg Tabs (Bisoprolol-hydrochlorothiazide) .Marland Kitchen... Take 1/2 tablet by mouth once daily    Adult Aspirin Low Strength 81 Mg Tbdp (Aspirin) .Marland Kitchen... Take 1 tablet by mouth once a day  Problem # 4:  OSTEOARTHRITIS (ICD-715.90) Assessment: Unchanged mild and good control  His updated medication list for this problem includes:    Etodolac 500 Mg Tabs (Etodolac) .Marland Kitchen... Take 1 by mouth once daily    Tramadol Hcl 50 Mg Tabs (Tramadol hcl) .Marland Kitchen... Take 1 tablet by mouth up to three times a day as needed for pain    Adult Aspirin Low Strength 81 Mg Tbdp (Aspirin) .Marland Kitchen... Take 1 tablet by mouth once a day  Complete Medication List: 1)  Prilosec 20 Mg Cpdr (Omeprazole) .... Take one 30-60 min before first and last meals of the day 2)  Etodolac 500 Mg Tabs (Etodolac) .... Take 1 by mouth once daily 3)  Pravastatin Sodium 40 Mg Tabs (Pravastatin sodium) .... Take one by mouth daily 4)  Colchicine 0.6 Mg Tabs (Colchicine) .... Take 1 tablet by mouth once daily to prevent gout 5)  Tramadol Hcl 50 Mg Tabs (Tramadol hcl) .... Take 1 tablet by mouth up to three times a day as needed for pain 6)  Bisoprolol-hydrochlorothiazide 5-6.25 Mg Tabs (Bisoprolol-hydrochlorothiazide) .... Take 1/2 tablet by mouth once daily 7)  Adult Aspirin Low Strength 81 Mg Tbdp (Aspirin) .... Take 1 tablet by mouth once a day 8)  Zyrtec 10 Mg Tabs (Cetirizine hcl) .... Take 1 tablet by mouth once a day 9)  Vitamin D3 1000 Unit Tabs (Cholecalciferol) .... Take 1  by mouth once daily 10)  Vitamin B-12 1000 Mcg Tabs (Cyanocobalamin) .... Take 1 by mouth once daily 11)  Multivitamins Tabs (Multiple vitamin) .... Take 1 tablet by mouth once a day 12)  Lutein Tabs (Lutein tabs) .... Take 1 tablet by mouth once a day 13)  Oxygen  .... Wear 2l/min continuously  Other Orders: Flu Vaccine 46yrs + (44034) Admin 1st Vaccine (74259) Admin 1st Vaccine [facility]) [phone])  Patient Instructions: 1)  Please schedule a follow-up appointment in 4 months .   Current Allergies (reviewed today): ! PENICILLIN ! SULFA   Influenza Vaccine    Vaccine Type: Fluvax 3+    Site: left deltoid    Mfr: GlaxoSmithKline    Dose: 0.5 ml    Route: IM    Given by: Mervin Hack CMA (AAMA)    Exp. Date: 04/17/2011    Lot #: [id number]    VIS given: 05/12/10 version given July 10, 2010.  Flu Vaccine Consent Questions    Do you have a history of severe allergic reactions to this vaccine? no    Any prior history of allergic reactions to egg and/or gelatin? no    Do you have a sensitivity to the  preservative Thimersol? no    Do you have a past history of Guillan-Barre Syndrome? no    Do you currently have an acute febrile illness? no    Have you ever had a severe reaction to latex? no    Vaccine information given and explained to patient? yes

## 2010-11-17 NOTE — Assessment & Plan Note (Signed)
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 Penicillin 2)  ! Sulfa  Past History:  Past medical, surgical, family and social histories (including risk factors) reviewed for relevance to current acute and chronic problems.  Past Medical History: Reviewed history from 07/09/2009 and no changes required. Allergic rhinitis Colonic polyps, hx of--tubular adenoma Diverticulosis, colon Gout Hypertension Osteoarthritis--hands, hips Pulmonary fibrosis-----------------------------------------Dr Wert   Hyperlipidemia GERD Aortic stenosis---------------------------------------------Dr Gwen Pounds   Carotid stenosis  Past Surgical History: Reviewed history from 08/23/2008 and no changes required. AVR (BOVINE ) [facility]  03/2004 CARDIAC CATH-- NO SIG. 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 Dad ided @87  lung disease, ?MI Mom died @58  ?valve disease, thyroid 1 sister with  breast cancer No HTN DM in Mom, mat GM No prostate or colon cancer Dad had melanoma  Social History: Reviewed history from 11/24/2007 and no changes required. Marital Status: Married Children: 1 Daughter in [place] Retired Tax inspector organist Former Smoker quit in [phone]  with no respiratory complaint Alcohol use-yes  Review of Systems       sleeps well nocturia x 1 weight is down 4#  Physical Exam  General:  alert and normal appearance.   Neck:  supple, no masses, no thyromegaly, no carotid bruits, and no cervical lymphadenopathy.   Lungs:  normal respiratory effort, no intercostal retractions, and no accessory muscle use.  Bibasilar dry crackles Heart:  normal rate, regular rhythm, and no gallop.   Opening snap LLSB and soft systolic murmur at base Msk:  no joint tenderness and no joint swelling.   Extremities:  no sig edema Psych:  normally interactive, good eye contact, not anxious appearing, and not depressed appearing.     Impression & Recommendations:  Problem # 1:  HYPERTENSION (ICD-401.9) Assessment Unchanged  good control no changes needed  His updated medication list for this problem includes:    Bisoprolol-hydrochlorothiazide 5-6.25 Mg Tabs (Bisoprolol-hydrochlorothiazide) .Marland Kitchen... Take 1/2 tablet by mouth once daily  BP today: 118/60 Prior BP: 120/70 (07/09/2009)  Labs Reviewed: K+: 4.1 (02/26/2009) Creat: : 0.8 (02/26/2009)   Chol: 160 (02/26/2009)   HDL: 22.30 (02/26/2009)   LDL: DEL (06/18/2008)   TG: 247.0 (02/26/2009)  Orders: TLB-Renal Function Panel (80069-RENAL) TLB-CBC Platelet - w/Differential (85025-CBCD) TLB-TSH (Thyroid Stimulating Hormone) (84443-TSH)  Problem # 2:  GOUT (ICD-274.9) Assessment:  Unchanged  doing well will check uric acid if $$ problems with colcrys, can try allopurinol  His updated medication list for this problem includes:    Etodolac 500 Mg Tabs (Etodolac) .Marland Kitchen... Take 1 by mouth once daily  as needed pain    Colchicine 0.6 Mg Tabs (Colchicine) .Marland Kitchen... Take 1 tablet by mouth once daily to prevent gout  Orders: TLB-Uric Acid, Blood (84550-URIC)  Problem # 3:  PULMONARY FIBROSIS (ICD-515) Assessment: Unchanged  stable resp status tramadol for cough  Orders: Prescription Created Electronically [phone])  Problem # 4:  AORTIC STENOSIS (ICD-424.1) Assessment: Unchanged asymptomatic  His updated medication list for this problem includes:    Adult Aspirin Low Strength 81 Mg Tbdp (Aspirin) .Marland Kitchen... Take 1 tablet by mouth once a day    Bisoprolol-hydrochlorothiazide 5-6.25 Mg Tabs (Bisoprolol-hydrochlorothiazide) .Marland Kitchen... Take 1/2 tablet by mouth once daily  Complete Medication List: 1)  Prilosec 20 Mg Cpdr (Omeprazole) .... Take 1 tablet by mouth two times a day 2)  Zyrtec 10 Mg Tabs (Cetirizine hcl) .... Take 1 tablet by mouth once a day 3)  Adult Aspirin Low Strength 81 Mg Tbdp (Aspirin) .... Take 1 tablet by mouth once a day 4)  Multivitamins Tabs (Multiple vitamin) .... Take 1 tablet by mouth once a day 5)  Lutein Tabs (Lutein tabs) .... Take 1 tablet by mouth once a day 6)  Etodolac 500 Mg Tabs (Etodolac) .... Take 1 by mouth once daily as needed pain 7)  Triamcinolone Acetonide 0.1 % Lotn (Triamcinolone acetonide) .... Apply two times a day as needed for itching in the ear 8)  Pravastatin Sodium 40 Mg Tabs (Pravastatin sodium) .... Take one by mouth daily 9)  Fish Oil Concentrate 1000 Mg Caps (Omega-3 fatty acids) .Marland Kitchen.. 1 daily by mouth 10)  Cvs Vitamin E 400 Unit Caps (Vitamin e) .Marland Kitchen.. 1 daily by mouth 11)  Coq-10 50 Mg Caps (Coenzyme q10) .Marland Kitchen.. 1 daily by mouth 12)  Colchicine 0.6 Mg Tabs (Colchicine) .... Take 1 tablet by mouth once daily to prevent gout 13)  Tramadol Hcl 50 Mg Tabs (Tramadol hcl) .... Take 1 tablet by mouth two times a day 14)  Bisoprolol-hydrochlorothiazide 5-6.25 Mg Tabs (Bisoprolol-hydrochlorothiazide) .... Take 1/2 tablet by mouth once daily 15)  Vitamin  D3 1000 Unit Tabs (Cholecalciferol) .... Take 1 by mouth once daily 16)  Clarithromycin 500 Mg Tabs (Clarithromycin) .... 2 by mouth times one 1 hour before proceedure 17)  Vitamin B-12 1000 Mcg Tabs (Cyanocobalamin) .... Take 1 by mouth once daily  Other Orders: TLB-Lipid Panel (80061-LIPID) TLB-Hepatic/Liver Function Pnl (80076-HEPATIC)  Patient Instructions: 1)  Please schedule a follow-up appointment in 4 months .  Prescriptions: TRAMADOL HCL 50 MG TABS (TRAMADOL HCL) take 1 tablet by mouth two times a day  #90 x 1   Entered and Authorized by:   Cindee Salt MD   Signed by:   Cindee Salt MD on 11/06/2009   Method used:   Electronically to        [facility] [phone]* (retail)       [address] [place], [place]  [address]       Ph: [phone] or [phone]       Fax: [phone]   RxID:   [id number]   Current Allergies (reviewed today): ! PENICILLIN ! SULFA

## 2010-11-17 NOTE — Progress Notes (Signed)
Summary: TRAMADOL  Phone Note Refill Request Message from:  [id number] on August 28, 2010 12:47 PM  Refills Requested: Medication #1:  TRAMADOL HCL 50 MG TABS take 1 tablet by mouth up to three times a day as needed for pain   Last Refilled: 07/28/2010 E-Scribe Request , ok to refill?   Method Requested: Electronic Initial call taken by: Mervin Hack CMA Duncan Dull),  August 28, 2010 12:47 PM  Follow-up for Phone Call        okay #90 x 1 Follow-up by: Cindee Salt MD,  August 28, 2010 1:29 PM  Additional Follow-up for Phone Call Additional follow up Details #1::        Rx faxed to pharmacy Additional Follow-up by: DeShannon Smith CMA Duncan Dull),  August 28, 2010 1:39 PM    Prescriptions: TRAMADOL HCL 50 MG TABS (TRAMADOL HCL) take 1 tablet by mouth up to three times a day as needed for pain  #90 Tablet x 1   Entered by:   Mervin Hack CMA (AAMA)   Authorized by:   Cindee Salt MD   Signed by:   Mervin Hack CMA (AAMA) on 08/28/2010   Method used:   Electronically to        [facility] [phone][facility] (retail)       [address] [place], [place]  [address]       Ph: [phone] or [phone]       Fax: [phone]   RxID:   [id number]

## 2010-11-17 NOTE — Assessment & Plan Note (Signed)
Summary: FOLLOW UP WITH URI   Vital Signs:  Patient profile:   77 year old male Weight:      236 pounds O2 Sat:      91 % on Room air Temp:     98.3 degrees F tympanic Pulse rate:   64 / minute Pulse rhythm:   regular Resp:     22 per minute BP sitting:   130 / 80  (left arm) Cuff size:   large  Vitals Entered By: Mervin Hack CMA Duncan Dull) (December 16, 2009 10:44 AM)  O2 Flow:  Room air CC: follow-up visit   History of Present Illness: Has improved somewhat still  with hacking cough--esp at night has to sit up to sleep breathing is better oxygen level is better-- cardiologist checked also No fever done with prednisone today and finished z-pak No SOB now  Due to leave for [facility] tomorrow AM  Saw the cardiologist yesterday---EKG was fine follow up set for 1 year  Allergies: 1)  ! Penicillin 2)  ! Sulfa  Past History:  Past medical, surgical, family and social histories (including risk factors) reviewed for relevance to current acute and chronic problems.  Past Medical History: Reviewed history from 07/09/2009 and no changes required. Allergic rhinitis Colonic polyps, hx of--tubular adenoma Diverticulosis, colon Gout Hypertension Osteoarthritis--hands, hips Pulmonary fibrosis-----------------------------------------Dr Wert   Hyperlipidemia GERD Aortic stenosis---------------------------------------------Dr Gwen Pounds   Carotid stenosis  Past Surgical History: Reviewed history from 08/23/2008 and no changes required. AVR (BOVINE ) [facility]  03/2004 CARDIAC CATH-- NO SIG. 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 Dad ided @87  lung disease, ?MI Mom died @58  ?valve disease, thyroid 1 sister with breast cancer No HTN DM in Mom, mat GM No prostate or colon cancer Dad had melanoma  Social  History: Reviewed history from 11/24/2007 and no changes required. Marital Status: Married Children: 1 Daughter in [place] Retired Tax inspector organist Former Smoker quit in [phone]  with no respiratory complaint Alcohol use-yes  Review of Systems       Eating a little better No vomiting  slight loose stool at first--seems better now  Physical Exam  General:  alert.  NAD Head:  no sinus tenderness Ears:  R ear normal and L ear normal.   Nose:  mild nasal congestion Mouth:  no erythema and no exudates.   Neck:  supple, no masses, and no cervical lymphadenopathy.   Lungs:  normal respiratory effort, no intercostal retractions, and no accessory muscle use.   typical bibasilar crackles   Impression & Recommendations:  Problem # 1:  BRONCHITIS- ACUTE (ICD-466.0) Assessment Improved better now No evidence of secondary pneumonia--though hard to judge from exam due to chronic crackles okay to travel to [place] If he starts having more trouble--esp with purulent sputum, would send Rx for avelox or levaquin  Problem # 2:  PULMONARY FIBROSIS (ICD-515) Assessment: Unchanged no major change prednisone seems to have helped control with no major exacerbation  Complete Medication List: 1)  Prilosec 20 Mg Cpdr (Omeprazole) .... Take 1 tablet by mouth two times a day 2)  Zyrtec 10 Mg Tabs (Cetirizine hcl) .... Take 1 tablet by mouth once a day 3)  Adult Aspirin Low Strength 81 Mg Tbdp (Aspirin) .... Take 1 tablet by mouth once a day 4)  Etodolac 500 Mg Tabs (Etodolac) .... Take 1 by mouth  once daily as needed pain 5)  Pravastatin Sodium 40 Mg Tabs (Pravastatin sodium) .... Take one by mouth daily 6)  Fish Oil Concentrate 1000 Mg Caps (Omega-3 fatty acids) .Marland Kitchen.. 1 daily by mouth 7)  Colchicine 0.6 Mg Tabs (Colchicine) .... Take 1 tablet by mouth once daily to prevent gout 8)  Tramadol Hcl 50 Mg Tabs (Tramadol hcl) .... Take 1 tablet by mouth two times a day 9)   Bisoprolol-hydrochlorothiazide 5-6.25 Mg Tabs (Bisoprolol-hydrochlorothiazide) .... Take 1/2 tablet by mouth once daily 10)  Vitamin D3 1000 Unit Tabs (Cholecalciferol) .... Take 1 by mouth once daily 11)  Clarithromycin 500 Mg Tabs (Clarithromycin) .... 2 by mouth times one 1 hour before proceedure 12)  Vitamin B-12 1000 Mcg Tabs (Cyanocobalamin) .... Take 1 by mouth once daily 13)  Multivitamins Tabs (Multiple vitamin) .... Take 1 tablet by mouth once a day 14)  Lutein Tabs (Lutein tabs) .... Take 1 tablet by mouth once a day 15)  Triamcinolone Acetonide 0.1 % Lotn (Triamcinolone acetonide) .... Apply two times a day as needed for itching in the ear 16)  Cvs Vitamin E 400 Unit Caps (Vitamin e) .Marland Kitchen.. 1 daily by mouth 17)  Coq-10 50 Mg Caps (Coenzyme q10) .Marland Kitchen.. 1 daily by mouth  Patient Instructions: 1)  Please keep regular follow up appt  Current Allergies (reviewed today): ! PENICILLIN ! SULFA

## 2010-11-17 NOTE — Assessment & Plan Note (Signed)
Summary: Pulmnary/ ext ov with walking sats on 4lpm = desat p 3 laps    Primary Provider/Referring Provider:  Alphonsus Sias  CC:  Dyspnea- some better.  History of Present Illness: 55 yowm quit smoking 1960's  with a history of pulmonary fibrosis dating back symptomatically to 2003 associated with the urge to clear his throat but little else in terms of significant symptoms at original pulmoary eval in  11/2006 assoc with cough better on ppi and microcytic anemia  June 02, 2010 never recovered from flu April 2011 and went driving to [place] in June not needing 02 when left but placed on it while there and drove back with it.  cough more than usual but dry. rec no oil based vitamins but still using perservision with soy oil  July 20, 2010 6 wk followup with PFT's.  Pt states that his breathing is some better since last seen.  He states that he still has the same cough- sometimes prod with green sputum.  Avoid oil based vitamins if possible ask your eye doctor for a substitue I will have your reviewed by our study doctor review your chart for eligibility  Wear 02 at all times @ 2lpm Late add:  increase 02 to 4lpm with activity more than just walking around the house  August 31, 2010 ov cc doe some better on new rx for take 4 with activity.  Pt denies any significant sore throat, dysphagia, itching, sneezing,  nasal congestion or excess secretions,  fever, chills, sweats, unintended wt loss, pleuritic or exertional cp, hempoptysis, change in activity tolerance  orthopnea pnd or leg swelling     Current Medications (verified): 1)  Prilosec 20 Mg  Cpdr (Omeprazole) .... Take One 30-60 Min Before First and Last Meals of The Day 2)  Etodolac 500 Mg  Tabs (Etodolac) .... Take 1 By Mouth Once Daily 3)  Pravastatin Sodium 40 Mg Tabs (Pravastatin Sodium) .... Take One By Mouth Daily 4)  Colchicine 0.6 Mg Tabs (Colchicine) .... Take 1 Tablet By Mouth Once Daily To Prevent Gout 5)  Tramadol Hcl 50 Mg Tabs  (Tramadol Hcl) .... Take 1 Tablet By Mouth Up To Three Times A Day As Needed For Pain 6)  Bisoprolol-Hydrochlorothiazide 5-6.25 Mg Tabs (Bisoprolol-Hydrochlorothiazide) .... Take 1/2 Tablet By Mouth Once Daily 7)  Adult Aspirin Low Strength 81 Mg  Tbdp (Aspirin) .... Take 1 Tablet By Mouth Once A Day 8)  Zyrtec 10 Mg  Tabs (Cetirizine Hcl) .... Take 1 Tablet By Mouth Once A Day 9)  Vitamin D3 1000 Unit Tabs (Cholecalciferol) .... Take 1 By Mouth Once Daily 10)  Vitamin B-12 1000 Mcg Tabs (Cyanocobalamin) .... Take 1 By Mouth Once Daily 11)  Multivitamins   Tabs (Multiple Vitamin) .... Take 1 Tablet By Mouth Once A Day 12)  Lutein   Tabs (Lutein Tabs) .... Take 1 Tablet By Mouth Once A Day 13)  Oxygen .... Wear 2l/min 24 H Per Day Increase To 4lpm With Ex  Allergies (verified): 1)  ! Penicillin 2)  ! Sulfa  Past History:  Past Medical History: Allergic rhinitis Colonic polyps, hx of--tubular adenoma Diverticulosis, colon Gout Hypertension Osteoarthritis--hands, hips Pulmonary fibrosis-----------------------------------------Dr Trajon Rosete       - PFT's 01/01/07 VC 56%  DLC0 42% not corrected for anemia     - PFT's 07/20/10 VC 48%  DLC 0 41%      - 02 dep since June 2011     - Review for Perfenidone trial August 31, 2010 >>>  VC below 50%, does not qualify Hyperlipidemia GERD Aortic stenosis---------------------------------------------Dr Gwen Pounds   Carotid stenosis  Vital Signs:  Patient profile:   77 year old male Weight:      239.38 pounds O2 Sat:      91 % on 2 L/min Temp:     97.5 degrees F oral Pulse rate:   75 / minute BP sitting:   100 / 60  (left arm) Cuff size:   large  Vitals Entered By: Vernie Murders (August 31, 2010 11:02 AM)  O2 Flow:  2 L/min  Serial Vital Signs/Assessments:  Comments: 11:30 AM Ambulatory Pulse Oximetry  Resting; HR___77__    02 Sat__94% on 4 liters___  Lap1 (185 feet)   HR__133___   02 Sat__92% on 4 liters___ Lap2 (185 feet)   HR___97__    02 Sat__89% on 4 liters___    Lap3 (185 feet)   HR__91___   02 Sat_____ 84% on 4 liters ___Test Completed without Difficulty _x__Test Stopped due to:   The patient completed 3 laps but did fall to 84% on the last lap...recovered within 2 minutes on 4 liters to 95%.  By: Michel Bickers CMA    Physical Exam  Additional Exam:  In general this is a well-preserved robust healthy-appearing ambulatory white male in no acute distresson 02 wt 234 >236  June 02, 2010 > 235 July 21, 2010 > 239 August 31, 2010  HEENT: nl dentition, turbinates, and orophanx. Nl external ear canals without cough reflex Neck without JVD/Nodes/TM Lungs  bilateral insp  crackles with a few sqeaks and pops on insp as well RRR no s3 or murmur or increase in P2, no edema Abd soft and benign with nl excursion in the supine position. No bruits or organomegaly Ext warm without calf tenderness, cyanosis but very subtle clubbing is present Skin warm and dry without lesions     Impression & Recommendations:  Problem # 1:  PULMONARY FIBROSIS (ICD-515)  DDx for pulmonary fibrosis with honeycombing includes idiopathic pulmonary fibrosis, pulmonary fibrosis associated with rheumatologic disease, adverse effect from  drugs such as chemotherapy or amiodarone exposure, nonspecific interstitial pneumonia which is typically steroid responsive, and chronic hypersensitivity pneumonitis.    Nl ESR and absence of clubbing noted.   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  Will refer chart for review of whether he might qualify for our PF study but really nothing else to offer but conservative f/u  Problem # 2:  RESPIRATORY FAILURE, CHRONIC (ICD-518.83)  02 rx reviewed, needs 2lpm x boost to 4 lpm with activity to prevent desat and learn to pace at slow flat grade  Orders: Pulse Oximetry, Ambulatory (14782)  Other Orders: Est. Patient Level III  (95621)  Patient Instructions: 1)  Wear 02 at all times @ 2lpm 2)  Increase 02 to 4lpm with activity more than just walking around the house 3)  I will let our fibrosis doctor review your chart and get back to you by Dec 1 re whether you qualify for the study 4)  Please schedule a follow-up appointment in 6 weeks, sooner if needed

## 2010-11-17 NOTE — Assessment & Plan Note (Signed)
Summary: 4 m f/u dlo   Vital Signs:  Patient profile:   77 year old male Weight:      234 pounds Temp:     98.8 degrees F oral Pulse rate:   64 / minute Pulse rhythm:   regular BP sitting:   116 / 68  (left arm) Cuff size:   large  Vitals Entered By: Mervin Hack CMA Duncan Dull) (Mar 09, 2010 3:48 PM) CC: 4 month follow-up   History of Present Illness: Still having hacking cough---  "more than he even reallzes" per wife Easy DOE but no sig change. Can walk flat for a mile without stopping can climb stairs if he goes slowly Takes tramadol and feels it helps some Hasn't been back to pulmonologist---appt on as needed basis  Occ heartburn---much better since he is careful with diet ON prilosec daily takes the zyrtec every day also  No chest pain No edema  Gout seems to be quiet Arthritis in hands has not been bad lately  Allergies: 1)  ! Penicillin 2)  ! Sulfa  Past History:  Past medical, surgical, family and social histories (including risk factors) reviewed for relevance to current acute and chronic problems.  Past Medical History: Reviewed history from 07/09/2009 and no changes required. Allergic rhinitis Colonic polyps, hx of--tubular adenoma Diverticulosis, colon Gout Hypertension Osteoarthritis--hands, hips Pulmonary fibrosis-----------------------------------------Dr Wert   Hyperlipidemia GERD Aortic stenosis---------------------------------------------Dr Gwen Pounds   Carotid stenosis  Past Surgical History: Reviewed history from 08/23/2008 and no changes required. AVR (BOVINE ) [facility]  03/2004 CARDIAC CATH-- NO SIG. 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 Dad ided @87  lung disease, ?MI Mom died @58  ?valve disease, thyroid 1 sister with breast cancer No HTN DM in Mom, mat GM No prostate or  colon cancer Dad had melanoma  Social History: Reviewed history from 11/24/2007 and no changes required. Marital Status: Married Children: 1 Daughter in [place] Retired Tax inspector organist Former Smoker quit in [phone]  with no respiratory complaint Alcohol use-yes  Review of Systems       sleeps okay appetite is fine weight fairly stable  Physical Exam  General:  alert and normal appearance.   Neck:  supple, no masses, no thyromegaly, no carotid bruits, and no cervical lymphadenopathy.   Lungs:  normal respiratory effort, no intercostal retractions, and no accessory muscle use.  Loud crackles across all lower lung fields Heart:  normal rate, regular rhythm, no murmur, and no gallop.   Abdomen:  soft and non-tender.   Pulses:  faint in feet Extremities:  no sig edema Psych:  normally interactive, good eye contact, not anxious appearing, and not depressed appearing.     Impression & Recommendations:  Problem # 1:  PULMONARY FIBROSIS (ICD-515) Assessment Unchanged ongoing cough will go back to pulmonary if cough is not improving  Problem # 2:  HYPERTENSION (ICD-401.9) Assessment: Unchanged good control no changes needed  His updated medication list for this problem includes:    Bisoprolol-hydrochlorothiazide 5-6.25 Mg Tabs (Bisoprolol-hydrochlorothiazide) .Marland Kitchen... Take 1/2 tablet by mouth once daily  BP today: 116/68 Prior BP: 130/80 (12/16/2009)  Labs Reviewed: K+: 4.3 (11/06/2009) Creat: : 1.0 (11/06/2009)   Chol: 159 (11/06/2009)   HDL: 21.10 (11/06/2009)   LDL: DEL (06/18/2008)   TG: 226.0 (11/06/2009)  Problem # 3:  GERD (ICD-530.81) Assessment: Unchanged seems to be adequately controlled doesn't seem to  be cause of cough  His updated medication list for this problem includes:    Prilosec 20 Mg Cpdr (Omeprazole) .Marland Kitchen... Take 1 tablet by mouth two times a day  Problem # 4:  GOUT (ICD-274.9) Assessment: Unchanged quiet now  His  updated medication list for this problem includes:    Etodolac 500 Mg Tabs (Etodolac) .Marland Kitchen... Take 1 by mouth once daily    Colchicine 0.6 Mg Tabs (Colchicine) .Marland Kitchen... Take 1 tablet by mouth once daily to prevent gout  Complete Medication List: 1)  Prilosec 20 Mg Cpdr (Omeprazole) .... Take 1 tablet by mouth two times a day 2)  Zyrtec 10 Mg Tabs (Cetirizine hcl) .... Take 1 tablet by mouth once a day 3)  Adult Aspirin Low Strength 81 Mg Tbdp (Aspirin) .... Take 1 tablet by mouth once a day 4)  Etodolac 500 Mg Tabs (Etodolac) .... Take 1 by mouth once daily 5)  Pravastatin Sodium 40 Mg Tabs (Pravastatin sodium) .... Take one by mouth daily 6)  Colchicine 0.6 Mg Tabs (Colchicine) .... Take 1 tablet by mouth once daily to prevent gout 7)  Tramadol Hcl 50 Mg Tabs (Tramadol hcl) .... Take 1 tablet by mouth up to three times a day as needed for pain 8)  Bisoprolol-hydrochlorothiazide 5-6.25 Mg Tabs (Bisoprolol-hydrochlorothiazide) .... Take 1/2 tablet by mouth once daily 9)  Fish Oil Concentrate 1000 Mg Caps (Omega-3 fatty acids) .Marland Kitchen.. 1 daily by mouth 10)  Vitamin D3 1000 Unit Tabs (Cholecalciferol) .... Take 1 by mouth once daily 11)  Vitamin B-12 1000 Mcg Tabs (Cyanocobalamin) .... Take 1 by mouth once daily 12)  Multivitamins Tabs (Multiple vitamin) .... Take 1 tablet by mouth once a day 13)  Lutein Tabs (Lutein tabs) .... Take 1 tablet by mouth once a day 14)  Cvs Vitamin E 400 Unit Caps (Vitamin e) .Marland Kitchen.. 1 daily by mouth 15)  Coq-10 50 Mg Caps (Coenzyme q10) .Marland Kitchen.. 1 daily by mouth  Patient Instructions: 1)  Please schedule a follow-up appointment in 4 months .  Prescriptions: ETODOLAC 500 MG  TABS (ETODOLAC) take 1 by mouth once daily  #90 x 3   Entered by:   Mervin Hack CMA (AAMA)   Authorized by:   Cindee Salt MD   Signed by:   Mervin Hack CMA (AAMA) on 03/09/2010   Method used:   Electronically to        [facility]* (mail-order)             ,          Ph: [phone]        Fax: [phone]   RxID:   [id number]   Current Allergies (reviewed today): ! PENICILLIN ! SULFA

## 2010-11-17 NOTE — Assessment & Plan Note (Signed)
Summary: Pulmonary/ ext ov/ increase to 4lpm with ex   Primary Provider/Referring Provider:  Alphonsus Sias  CC:  75 wk followup with PFT's.  Pt states that his breathing is some better since last seen.  He states that he still has the same cough- sometimes prod with green sputum.  No new complaints today.Marland Kitchen  History of Present Illness: 77 yowm quit smoking 1960's  with a history of pulmonary fibrosis dating back symptomatically to 2003 associated with the urge to clear his throat but little else in terms of significant symptoms at original pulmoary eval in  11/2006 assoc with cough better on ppi and microcytic anemia  June 02, 2010 never recovered from flu April 2011 and went driving to [place] in June not needing 02 when left but placed on it while there and drove back with it.  cough more than usual but dry. rec no oil based vitamins but still using perservision with soy oil  July 20, 2010 6 wk followup with PFT's.  Pt states that his breathing is some better since last seen.  He states that he still has the same cough- sometimes prod with green sputum.  No new complaints today.  Pt denies any significant sore throat, dysphagia, itching, sneezing,  nasal congestion or excess secretions,  fever, chills, sweats, unintended wt loss, pleuritic or exertional cp, hempoptysis, change in activity tolerance  orthopnea pnd or leg swelling Pt also denies any obvious fluctuation in symptoms with weather or environmental change or other alleviating or aggravating factors.       Current Medications (verified): 1)  Prilosec 20 Mg  Cpdr (Omeprazole) .... Take One 30-60 Min Before First and Last Meals of The Day 2)  Etodolac 500 Mg  Tabs (Etodolac) .... Take 1 By Mouth Once Daily 3)  Pravastatin Sodium 40 Mg Tabs (Pravastatin Sodium) .... Take One By Mouth Daily 4)  Colchicine 0.6 Mg Tabs (Colchicine) .... Take 1 Tablet By Mouth Once Daily To Prevent Gout 5)  Tramadol Hcl 50 Mg Tabs (Tramadol Hcl) .... Take 1  Tablet By Mouth Up To Three Times A Day As Needed For Pain 6)  Bisoprolol-Hydrochlorothiazide 5-6.25 Mg Tabs (Bisoprolol-Hydrochlorothiazide) .... Take 1/2 Tablet By Mouth Once Daily 7)  Adult Aspirin Low Strength 81 Mg  Tbdp (Aspirin) .... Take 1 Tablet By Mouth Once A Day 8)  Zyrtec 10 Mg  Tabs (Cetirizine Hcl) .... Take 1 Tablet By Mouth Once A Day 9)  Vitamin D3 1000 Unit Tabs (Cholecalciferol) .... Take 1 By Mouth Once Daily 10)  Vitamin B-12 1000 Mcg Tabs (Cyanocobalamin) .... Take 1 By Mouth Once Daily 11)  Multivitamins   Tabs (Multiple Vitamin) .... Take 1 Tablet By Mouth Once A Day 12)  Lutein   Tabs (Lutein Tabs) .... Take 1 Tablet By Mouth Once A Day 13)  Oxygen .... Wear 2l/min Continuously  Allergies (verified): 1)  ! Penicillin 2)  ! Sulfa  Past History:  Past Medical History: Allergic rhinitis Colonic polyps, hx of--tubular adenoma Diverticulosis, colon Gout Hypertension Osteoarthritis--hands, hips Pulmonary fibrosis-----------------------------------------Dr Wert       - PFT's 01/01/07 VC 56%  DLC0 42% not corrected for anemia     - PFT's 07/20/10 VC 48%  DLC 0 41%      - 02 dep since June 2011 Hyperlipidemia GERD Aortic stenosis---------------------------------------------Dr Gwen Pounds   Carotid stenosis  Vital Signs:  Patient profile:   77 year old male Weight:      235 pounds O2 Sat:  90 % on 2 L/min Temp:     97.9 degrees F oral Pulse rate:   67 / minute BP sitting:   114 / 68  (left arm)  Vitals Entered By: Vernie Murders (July 20, 2010 2:07 PM)  O2 Flow:  2 L/min  Serial Vital Signs/Assessments:  Comments: 2:50 PM Ambulatory Pulse Oximetry  Resting; HR__71___    02 Sat___94% on 2 liters pulsing__  Lap1 (185 feet)   HR__85___   02 Sat__93% on 2 liters pulsing___ Lap2 (185 feet)   HR__90___   02 Sat__88% on 2 liters pulsing, bumped up to 3 liters pulsing and sats did not recover, bumped to 4 liters pulsing and sats recovered to 95%___      Lap3 (185 feet)   HR__85___   02 Sat__94% on 4 liters pulsing___  ___Test Completed without Difficulty _x__Test Stopped due to: had to stop after 2 laps due to drop in sats on 2 liters pulsing...patients sats stayed at 94% on 4 liters pulsing  By: Michel Bickers CMA    Physical Exam  Additional Exam:  In general this is a well-preserved robust healthy-appearing ambulatory white male in no acute distresson 02 wt 234 >236  June 02, 2010 > 235 July 21, 2010  HEENT: nl dentition, turbinates, and orophanx. Nl external ear canals without cough reflex Neck without JVD/Nodes/TM Lungs  bilateral insp  crackles with a few sqeaks and pops on insp as well RRR no s3 or murmur or increase in P2, no edema Abd soft and benign with nl excursion in the supine position. No bruits or organomegaly Ext warm without calf tenderness, cyanosis but very subtle clubbing is present Skin warm and dry without lesions     Impression & Recommendations:  Problem # 1:  PULMONARY FIBROSIS (ICD-515) DDx for pulmonary fibrosis with honeycombing includes idiopathic pulmonary fibrosis, pulmonary fibrosis associated with rheumatologic disease, adverse effect from  drugs such as chemotherapy or amiodarone exposure, nonspecific interstitial pneumonia which is typically steroid responsive, and chronic hypersensitivity pneumonitis.    Nl ESR and absence of clubbing noted.   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  Will refer chart for review of whether he might qualify for our PF study but really nothing else to offer but conservative f/u  Problem # 2:  RESPIRATORY FAILURE, CHRONIC (ICD-518.83)  02 rx reviewed, needs 2lpm x boost to 4 lpm with activity to prevent desat  Medications Added to Medication List This Visit: 1)  Oxygen  .... Wear 2l/min 24 h per day increase to 4lpm with ex  Other Orders: Est. Patient Level IV (99214) Pulse Oximetry,  Ambulatory (16109)  Patient Instructions: 1)  Avoid oil based vitamins if possible ask your eye doctor for a substitue 2)  I will have your reviewed by our study doctor review your chart for eligibility  3)  Please schedule a follow-up appointment in 6 weeks, sooner if needed  4)  Wear 02 at all times @ 2lpm 5)  Late add:  increase 02 to 4lpm with activity more than just walking around the house  Appended Document: Pulmonary/ ext ov/ increase to 4lpm with ex LMOMTCB- need to advise pt of late add recs per MW.  Appended Document: Pulmonary/ ext ov/ increase to 4lpm with ex Spoke with pt and notified od late add recs per MW.  Pt verbalized understanding.

## 2010-11-17 NOTE — Letter (Signed)
Summary: CMN for Oxygen/[facility]  CMN for Oxygen/[facility]   Imported By: Lanelle Bal 05/19/2010 10:29:17  _____________________________________________________________________  External Attachment:    Type:   Image     Comment:   External Document

## 2010-11-17 NOTE — Progress Notes (Signed)
Summary: Rx Tramadol  Phone Note Refill Request Call back at [phone] Message from:  [facility] on May 12, 2010 1:57 PM  Refills Requested: Medication #1:  TRAMADOL HCL 50 MG TABS take 1 tablet by mouth up to three times a day as needed for pain No last refill date sent   Method Requested: Electronic Initial call taken by: Janee Morn CMA,  May 12, 2010 1:57 PM  Follow-up for Phone Call        okay #90 x 0 Follow-up by: Cindee Salt MD,  May 12, 2010 2:12 PM  Additional Follow-up for Phone Call Additional follow up Details #1::        Rx called to pharmacy Additional Follow-up by: Linde Gillis CMA Duncan Dull),  May 12, 2010 2:37 PM    Prescriptions: TRAMADOL HCL 50 MG TABS (TRAMADOL HCL) take 1 tablet by mouth up to three times a day as needed for pain  #90 Tablet x 0   Entered by:   Linde Gillis CMA (AAMA)   Authorized by:   Cindee Salt MD   Signed by:   Linde Gillis CMA (AAMA) on 05/12/2010   Method used:   Telephoned to ...       [facility] [phone]* (retail)       [address] [place], [place]  [address]       Ph: [phone] or [phone]       Fax: [phone]   RxID:   [id number]

## 2010-11-17 NOTE — Assessment & Plan Note (Signed)
Summary: COUGH AND CONGESTION   Vital Signs:  Patient profile:   77 year old male Weight:      235.38 pounds BMI:     32.95 Temp:     98.1 degrees F oral Pulse rate:   66 / minute Pulse rhythm:   regular BP sitting:   100 / 52  (right arm) Cuff size:   large  Vitals Entered By: Linde Gillis CMA Duncan Dull) (December 10, 2009 4:16 PM) CC: cough, congestion   History of Present Illness: 77 year old male:  last night could not really get his breath finally, spent the night in a recliner  strted to get sick some yesterday. Had the sniffles for a couple of days.   Acute Visit History:      The patient complains of cough, headache, and nasal discharge.  These symptoms began 2 days ago.  He denies earache, fever, musculoskeletal symptoms, nausea, sinus problems, and sore throat.  Other comments include: HISTORY OF PULMONARY FIBROSIS.        The patient notes wheezing.  The cough interferes with his sleep.  The character of the cough is described as productive.  He has a history of COPD.  There is no history of shortness of breath, respiratory retractions, tachypnea, cyanosis, or interference with oral intake associated with his cough.        Urine output has been normal.  He is tolerating clear liquids.        Allergies: 1)  ! Penicillin 2)  ! Sulfa  Past History:  Past medical, surgical, family and social histories (including risk factors) reviewed, and no changes noted (except as noted below).  Past Medical History: Reviewed history from 07/09/2009 and no changes required. Allergic rhinitis Colonic polyps, hx of--tubular adenoma Diverticulosis, colon Gout Hypertension Osteoarthritis--hands, hips Pulmonary fibrosis-----------------------------------------Dr Wert   Hyperlipidemia GERD Aortic stenosis---------------------------------------------Dr Gwen Pounds   Carotid stenosis  Past Surgical History: Reviewed history from 08/23/2008 and no changes required. AVR (BOVINE )  [facility]  03/2004 CARDIAC CATH-- NO SIG. 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 Dad ided @87  lung disease, ?MI Mom died @58  ?valve disease, thyroid 1 sister with breast cancer No HTN DM in Mom, mat GM No prostate or colon cancer Dad had melanoma  Social History: Reviewed history from 11/24/2007 and no changes required. Marital Status: Married Children: 1 Daughter in [place] Retired Tax inspector organist Former Smoker quit in [phone]  with no respiratory complaint Alcohol use-yes  Review of Systems       REVIEW OF SYSTEMS GEN: Acute illness details above. CV: No chest pain GI: No noted N or V Otherwise, pertinent positives and negatives are noted in the HPI.   Physical Exam  General:  Well-developed,well-nourished,in no acute distress; alert,appropriate and cooperative throughout examination Head:  normocephalic and atraumatic.   Ears:  External ear exam shows no significant lesions or deformities.  Otoscopic examination reveals clear canals, tympanic membranes are intact bilaterally without bulging, retraction, inflammation or discharge. Hearing is grossly normal bilaterally. Nose:  no external deformity.   Mouth:  Oral mucosa and oropharynx without lesions or exudates.  Teeth in good repair. Neck:  No deformities, masses, or tenderness noted. Lungs:  normal respiratory effort, no intercostal retractions, and no accessory muscle use.  Bibasilar dry crackles. scattered wheezes Heart:  normal rate, regular rhythm, and no gallop.  soft systolic murmur at base Extremities:  no edema Neurologic:  alert & oriented X3 and gait normal.   Psych:  Cognition and judgment appear intact. Alert and cooperative with normal attention span and concentration. No apparent delusions, illusions,  hallucinations   Impression & Recommendations:  Problem # 1:  BRONCHITIS- ACUTE (ICD-466.0) Assessment Deteriorated Acutely wheezing with active infection with pulmonary fibrosis  Prednisone burst x 1 week and cover with ABX  His updated medication list for this pr oblem includes:    Clarithromycin 500 Mg Tabs (Clarithromycin) .Marland Kitchen... 2 by mouth times one 1 hour before proceedure    Ciprofloxacin Hcl 500 Mg Tabs (Ciprofloxacin hcl) .Marland Kitchen... Take 1 by mouth two times a day    Azithromycin 250 Mg Tabs (Azithromycin) .Marland Kitchen... 2 by  mouth today and then 1 daily for 4 days  Problem # 2:  PULMONARY FIBROSIS (ICD-515) Assessment: Deteriorated Prednisone 40 mg x 7 days  Complete Medication List: 1)  Prilosec 20 Mg Cpdr (Omeprazole) .... Take 1 tablet by mouth two times a day 2)  Zyrtec 10 Mg Tabs (Cetirizine hcl) .... Take 1 tablet by mouth once a day 3)  Adult Aspirin Low Strength 81 Mg Tbdp (Aspirin) .... Take 1 tablet by mouth once a day 4)  Multivitamins Tabs (Multiple vitamin) .... Take 1 tablet by mouth once a day 5)  Lutein Tabs (Lutein tabs) .... Take 1 tablet by mouth once a day 6)  Etodolac 500 Mg Tabs (Etodolac) .... Take 1 by mouth once daily as needed pain 7)  Triamcinolone Acetonide 0.1 % Lotn (Triamcinolone acetonide) .... Apply two times a day as needed for itching in the ear 8)  Pravastatin Sodium 40 Mg Tabs (Pravastatin sodium) .... Take one by mouth daily 9)  Fish Oil Concentrate 1000 Mg Caps (Omega-3 fatty acids) .Marland Kitchen.. 1 daily by mouth 10)  Cvs Vitamin E 400 Unit Caps (Vitamin e) .Marland Kitchen.. 1 daily by mouth 11)  Coq-10 50 Mg Caps (Coenzyme q10) .Marland Kitchen.. 1 daily by mouth 12)  Colchicine 0.6 Mg Tabs (Colchicine) .... Take 1 tablet by mouth once daily to prevent gout 13)  Tramadol Hcl 50 Mg Tabs (Tramadol hcl) .... Take 1 tablet by mouth two times a day 14)  Bisoprolol-hydrochlorothiazide 5-6.25 Mg Tabs (Bisoprolol-hydrochlorothiazide) .... Take 1/2 tablet by mouth once daily 15)  Vitamin  D3 1000 Unit Tabs (Cholecalciferol) .... Take 1 by mouth once daily 16)  Clarithromycin 500 Mg Tabs (Clarithromycin) .... 2 by mouth times one 1 hour before proceedure 17)  Vitamin B-12 1000 Mcg Tabs (Cyanocobalamin) .... Take 1 by mouth once daily 18)  Azithromycin 250 Mg Tabs (Azithromycin) .... 2 by  mouth today and then 1 daily for 4 days 19)  Prednisone 20 Mg Tabs (Prednisone) .... 2 tabs by mouth daily Prescriptions: PREDNISONE 20 MG TABS (PREDNISONE) 2 tabs by mouth daily  #14 x 0   Entered and Authorized by:   Hannah Beat MD   Signed by:   Hannah Beat MD on 12/10/2009   Method used:   [facility]        [facility] [phone][facility] (retail)       [address] [place], [place]  [address]       Ph: [phone] or [phone]       Fax: [phone]   RxID:   [id number] AZITHROMYCIN 250 MG  TABS (AZITHROMYCIN) 2 by  mouth today and  then 1 daily for 4 days  #6 x 0   Entered and Authorized by:   Hannah Beat MD   Signed by:   Hannah Beat MD on 12/10/2009   Method used:   [facility]ronically to        [facility] [phone]* (retail)       [address] [place], [place]  [address]       Ph: [phone] or [phone]       Fax: [phone]   RxID:   [id number]   Current Allergies (reviewed today): ! PENICILLIN ! SULFA

## 2010-11-17 NOTE — Progress Notes (Signed)
Summary: problems breathing  Phone Note Call from Patient   Caller: Patient Call For: Cindee Salt MD Summary of Call: Pt is in [place] and is having some problems breathing, with the elevation.  He had gone through [place] and he said that was very difficult.  He asked if he could get an order for oxygen,  to use while he is there, sent to him.  I told him no, he will need to see a doctor there- urgent care or ER. Initial call taken by: Lowella Petties CMA,  April 10, 2010 10:04 AM  Follow-up for Phone Call        yes---- high elevation dyspnea could be helped by oxygen but not possibel to order in another state without acute visit  He might acclimate over time   Pleaes check back with him later Follow-up by: Cindee Salt MD,  April 10, 2010 10:26 AM  Additional Follow-up for Phone Call Additional follow up Details #1::        patient understands Additional Follow-up by: Mervin Hack CMA Duncan Dull),  April 10, 2010 12:20 PM

## 2010-11-17 NOTE — Progress Notes (Signed)
Summary: regarding oxygen  Phone Note Call from Patient Call back at [phone]   Caller: Patient Summary of Call: Pt states he went to an urgent care in [place] and was prescribed 3 litres of oxygen.  He said they checked his o2 sat and it was 74%.  They are going to give him a portable unit to travel home with that he will mail back to them. Initial call taken by: Lowella Petties CMA,  April 10, 2010 2:11 PM  Follow-up for Phone Call        okay we can reevaluate him here and decide if he needs ongoing Rx Follow-up by: Cindee Salt MD,  April 10, 2010 2:30 PM  Additional Follow-up for Phone Call Additional follow up Details #1::        Left message on cell phone for patientwith results  Additional Follow-up by: Mervin Hack CMA Duncan Dull),  April 10, 2010 3:46 PM

## 2010-11-17 NOTE — Assessment & Plan Note (Signed)
Summary: Pulmonary/ ext f/u ov now 02 dep   Primary Provider/Referring Provider:  Alphonsus Sias  CC:  last seen 2009 and was started on o2 while on vacation in [place] in [place]/ sat of 74%ra.  c/o increased SOB with activity since June.  History of Present Illness: 53 yowm quit smoking 1960's  with a history of pulmonary fibrosis dating back symptomatically to 2003 associated with the urge to clear his throat but little else in terms of significant symptoms at original pulmoary eval in  11/2006 assoc with cough better on ppi and microcytic anemia  June 02, 2010 never recovered from flu April 2011 and went driving to [place] in June not needing 02 when left but placed on it while there and drove back with it.  cough more than usual but dry.  Pt denies any significant sore throat, dysphagia, itching, sneezing,  nasal congestion or excess secretions,  fever, chills, sweats, unintended wt loss, pleuritic or exertional cp, hempoptysis,  orthopnea pnd or leg swelling . Pt also denies any obvious fluctuation in symptoms with weather or environmental change or other alleviating or aggravating factors.       Current Medications (verified): 1)  Prilosec 20 Mg  Cpdr (Omeprazole) .... Take 1 Tablet By Mouth Two Times A Day 2)  Zyrtec 10 Mg  Tabs (Cetirizine Hcl) .... Take 1 Tablet By Mouth Once A Day 3)  Adult Aspirin Low Strength 81 Mg  Tbdp (Aspirin) .... Take 1 Tablet By Mouth Once A Day 4)  Etodolac 500 Mg  Tabs (Etodolac) .... Take 1 By Mouth Once Daily 5)  Pravastatin Sodium 40 Mg Tabs (Pravastatin Sodium) .... Take One By Mouth Daily 6)  Colchicine 0.6 Mg Tabs (Colchicine) .... Take 1 Tablet By Mouth Once Daily To Prevent Gout 7)  Tramadol Hcl 50 Mg Tabs (Tramadol Hcl) .... Take 1 Tablet By Mouth Up To Three Times A Day As Needed For Pain 8)  Bisoprolol-Hydrochlorothiazide 5-6.25 Mg Tabs (Bisoprolol-Hydrochlorothiazide) .... Take 1/2 Tablet By Mouth Once Daily 9)  Fish Oil Concentrate 1000 Mg Caps  (Omega-3 Fatty Acids) .Marland Kitchen.. 1 Daily By Mouth 10)  Vitamin D3 1000 Unit Tabs (Cholecalciferol) .... Take 1 By Mouth Once Daily 11)  Vitamin B-12 1000 Mcg Tabs (Cyanocobalamin) .... Take 1 By Mouth Once Daily 12)  Multivitamins   Tabs (Multiple Vitamin) .... Take 1 Tablet By Mouth Once A Day 13)  Lutein   Tabs (Lutein Tabs) .... Take 1 Tablet By Mouth Once A Day 14)  Cvs Vitamin E 400 Unit Caps (Vitamin E) .Marland Kitchen.. 1 Daily By Mouth 15)  Coq-10 50 Mg Caps (Coenzyme Q10) .Marland Kitchen.. 1 Daily By Mouth 16)  Oxygen .... Wear 2l/min Continuously  Allergies (verified): 1)  ! Penicillin 2)  ! Sulfa  Past History:  Past Medical History: Allergic rhinitis Colonic polyps, hx of--tubular adenoma Diverticulosis, colon Gout Hypertension Osteoarthritis--hands, hips Pulmonary fibrosis-----------------------------------------Dr Galia Rahm       - PFT's 01/01/07 VC 56%  DLC0 42% not corrected for anemia     - 02 dep since June 2011 Hyperlipidemia GERD Aortic stenosis---------------------------------------------Dr Gwen Pounds   Carotid stenosis  Vital Signs:  Patient profile:   77 year old male Height:      71 inches Weight:      236.50 pounds BMI:     33.10 O2 Sat:      91 % on 2 L/min pulsing Temp:     97.0 degrees F oral Pulse rate:   70 / minute BP sitting:   130 /  70  (left arm) Cuff size:   regular  Vitals Entered By: Boone Master CNA/MA (June 02, 2010 10:16 AM)  O2 Flow:  2 L/min pulsing CC: last seen 2009, was started on o2 while on vacation in [place] in [place]/ sat of 74%ra.  c/o increased SOB with activity since June Comments Medications reviewed with patient Daytime contact number verified with patient. Boone Master CNA/MA  June 02, 2010 10:16 AM    Physical Exam  Additional Exam:  In general this is a well-preserved robust healthy-appearing ambulatory white male in no acute distresson 02 wt 234 >236  June 02, 2010  HEENT: nl dentition, turbinates, and orophanx. Nl external ear canals  without cough reflex Neck without JVD/Nodes/TM Lungs clear to A and P bilaterally without cough on insp or exp maneuvers except for minimum crackles in the bases on inspiration only RRR no s3 or murmur or increase in P2, no edema Abd soft and benign with nl excursion in the supine position. No bruits or organomegaly Ext warm without calf tenderness, cyanosis but very subtle clubbing is present Skin warm and dry without lesions     CXR  Procedure date:  06/02/2010  Findings:        Comparison: November 24, 2007   Findings: Diffuse, nodular interstitial thickening appears worse now than on the prior study.  There is also more pleural thickening, especially in the lateral right upper lobe.  The cardiac silhouette, mediastinum, pulmonary vasculature are within normal limits.   IMPRESSION: Interval progression of interstitial lung disease.  Impression & Recommendations:  Problem # 1:  PULMONARY FIBROSIS (ICD-515) Now 02 dep even [place].  Previous w/u with no elevation of esr but sign anemia was felt to be aggravating the problem and now also carries dx of Aortic stenosis which can also limit cardiac compensation for low pa02.  REc recheck cbc/ bnp next blood draw, continue 02 and f/u pft's   Problem # 2:  GERD (ICD-530.81)  His updated medication list for this problem includes:    Prilosec 20 Mg Cpdr (Omeprazole) .Marland Kitchen... Take one 30-60 min before first and last meals of the day  Add diet restrictions including avoidance of oils since impossible to exclude element of lipoid pneumonia given the back ground of PF and very limited data in terms of proven benefit of fish oil or any oil in this setting taken by mouth   Medications Added to Medication List This Visit: 1)  Prilosec 20 Mg Cpdr (Omeprazole) .... Take one 30-60 min before first and last meals of the day 2)  Oxygen  .... Wear 2l/min continuously  Other Orders: Est. Patient Level IV (99214) T-2 View CXR  (71020TC)  Patient Instructions: 1)  GERD (REFLUX)  is a common cause of respiratory symptoms. It commonly presents without heartburn and can be treated with medication, but also with lifestyle changes including avoidance of late meals, excessive alcohol, smoking cessation, and avoid fatty foods, chocolate, peppermint, colas, red wine, and acidic juices such as orange juice. NO MINT OR MENTHOL PRODUCTS SO NO COUGH DROPS  2)  USE SUGARLESS CANDY INSTEAD (jolley ranchers)  3)  NO OIL BASED VITAMINS  4)  Please schedule a follow-up appointment in 6 weeks, sooner if needed with PFT's   Immunization History:  Pneumovax Immunization History:    Pneumovax:  historical (07/18/2008)

## 2010-11-17 NOTE — Progress Notes (Signed)
Summary: Tramadol 50mg  refill  Phone Note Refill Request Call back at [phone] Message from:  [facility] on April 02, 2010 3:11 PM  Refills Requested: Medication #1:  TRAMADOL HCL 50 MG TABS take 1 tablet by mouth up to three times a day as needed for pain [facility] [address] request refill electronically for Tramadol 50mg . No refill date sent. Please advise.    Method Requested: Telephone to Pharmacy Initial call taken by: Lewanda Rife LPN,  April 02, 2010 3:12 PM  Follow-up for Phone Call        okay #90 x 1 Follow-up by: Cindee Salt MD,  April 02, 2010 5:30 PM  Additional Follow-up for Phone Call Additional follow up Details #1::        [facility] as instructed. Lewanda Rife LPN  April 02, 2010 5:35 PM

## 2010-11-17 NOTE — Miscellaneous (Signed)
Summary: Orders Update pft charges  Clinical Lists Changes  Orders: Added new Service order of Carbon Monoxide diffusing w/capacity (94720) - Signed Added new Service order of Lung Volumes (94240) - Signed Added new Service order of Spirometry (Pre & Post) (94060) - Signed 

## 2010-11-19 NOTE — Progress Notes (Signed)
Summary: TRAMADOL  Phone Note Refill Request Message [phone] on November 09, 2010 11:37 AM  Refills Requested: Medication #1:  TRAMADOL HCL 50 MG TABS take 1 tablet by mouth up to three times a day as needed for pain   Last Refilled: 10/03/2010 E-Scribe Request    Method Requested: Telephone to Pharmacy Initial call taken by: Mervin Hack CMA Duncan Dull),  November 09, 2010 11:37 AM  Follow-up for Phone Call        okay #90 x 1 Follow-up by: Cindee Salt MD,  November 09, 2010 1:48 PM  Additional Follow-up for Phone Call Additional follow up Details #1::        Rx faxed to pharmacy Additional Follow-up by: DeShannon Katrinka Blazing CMA Duncan Dull),  November 09, 2010 2:27 PM    Prescriptions: TRAMADOL HCL 50 MG TABS (TRAMADOL HCL) take 1 tablet by mouth up to three times a day as needed for pain  #90 Tablet x 1   Entered by:   Mervin Hack CMA (AAMA)   Authorized by:   Cindee Salt MD   Signed by:   Mervin Hack CMA (AAMA) on 11/09/2010   Method used:   Electronically to        [facility] [phone]* (retail)       [address] [place], [place]  [address]       Ph: [phone] or [phone]       Fax: [phone]   RxID:   [id number]

## 2010-11-19 NOTE — Assessment & Plan Note (Signed)
Summary: Pulmonary/ ext f/u with walking desats on 4lpm p 1 lap   Primary Provider/Referring Provider:  Alphonsus Sias  CC:  Dyspnea- the same.  History of Present Illness: 89 yowm quit smoking 1960's  with a history of pulmonary fibrosis dating back symptomatically to 2003 associated with the urge to clear his throat but little else in terms of significant symptoms at original pulmoary eval in  11/2006 assoc with cough better on ppi and microcytic anemia  June 02, 2010 never recovered from flu April 2011 and went driving to [place] in June not needing 02 when left but placed on it while there and drove back with it.  cough more than usual but dry. rec no oil based vitamins but still using perservision with soy oil  July 20, 2010 6 wk followup with PFT's.  Pt states that his breathing is some better since last seen.  He states that he still has the same cough- sometimes prod with green sputum.  Avoid oil based vitamins if possible ask your eye doctor for a substitue I will have your reviewed by our study doctor review your chart for eligibility  Wear 02 at all times @ 2lpm Late add:  increase 02 to 4lpm with activity more than just walking around the house  August 31, 2010 ov cc doe some better on new rx for take 4 with activity.   Wear 02 at all times @ 2lpm Increase 02 to 4lpm with activity more than just walking around the house  November 04, 2010 ov cc sob no change.  no sign cough. Pt denies any significant sore throat, dysphagia, itching, sneezing,  nasal congestion or excess secretions,  fever, chills, sweats, unintended wt loss, pleuritic or exertional cp, hempoptysis, change in activity tolerance  orthopnea pnd or leg swelling Pt also denies any obvious fluctuation in symptoms with weather or environmental change or other alleviating or aggravating factors.     no arthralgias  Current Medications (verified): 1)  Prilosec 20 Mg  Cpdr (Omeprazole) .... Take One 30-60 Min Before First and  Last Meals of The Day 2)  Etodolac 500 Mg  Tabs (Etodolac) .... Take 1 By Mouth Once Daily 3)  Pravastatin Sodium 40 Mg Tabs (Pravastatin Sodium) .... Take One By Mouth Daily 4)  Colchicine 0.6 Mg Tabs (Colchicine) .... Take 1 Tablet By Mouth Once Daily To Prevent Gout 5)  Tramadol Hcl 50 Mg Tabs (Tramadol Hcl) .... Take 1 Tablet By Mouth Up To Three Times A Day As Needed For Pain 6)  Adult Aspirin Low Strength 81 Mg  Tbdp (Aspirin) .... Take 1 Tablet By Mouth Once A Day 7)  Zyrtec 10 Mg  Tabs (Cetirizine Hcl) .... Take 1 Tablet By Mouth Once A Day 8)  Vitamin D3 1000 Unit Tabs (Cholecalciferol) .... Take 1 By Mouth Once Daily 9)  Vitamin B-12 1000 Mcg Tabs (Cyanocobalamin) .... Take 1 By Mouth Once Daily 10)  Multivitamins   Tabs (Multiple Vitamin) .... Take 1 Tablet By Mouth Once A Day 11)  Lutein   Tabs (Lutein Tabs) .... Take 1 Tablet By Mouth Once A Day 12)  Oxygen .... Wear 2l/min 24 H Per Day Increase To 4lpm With Ex 13)  Slow Release Iron 140 (45 Fe) Mg Cr-Tabs (Ferrous Sulfate Dried) .Marland Kitchen.. 1 Once Daily  Allergies (verified): 1)  ! Penicillin 2)  ! Sulfa  Past History:  Past Medical History: Allergic rhinitis Colonic polyps, hx of--tubular adenoma Diverticulosis, colon Gout Hypertension Osteoarthritis--hands, hips Pulmonary fibrosis-----------------------------------------Dr  Emile Kyllo       - PFT's 01/01/07 VC 56%  DLC0 42% not corrected for anemia     - PFT's 07/20/10 VC 48%  DLC 0 41%      - 02 dep since June 2011     - Review for Perfenidone trial August 31, 2010 >>> VC below 50%, does not qualify      -Desats on pulsed 02 a@  4lpm November 04, 2010 x one lap Hyperlipidemia GERD Aortic stenosis---------------------------------------------Dr Gwen Pounds       - sp avr 2005 Carotid stenosis  Vital Signs:  Patient profile:   77 year old male Weight:      228.38 pounds O2 Sat:      91 % on 2.5 L/min Temp:     97.5 degrees F oral Pulse rate:   86 / minute BP sitting:   120 /  70  (left arm) Cuff size:   large  Vitals Entered By: Vernie Murders (November 04, 2010 11:52 AM)  O2 Flow:  2.5 L/min  Serial Vital Signs/Assessments:  Comments: 12:29 PM Ambulatory Pulse Oximetry  Resting; HR__87___    02 Sat__93%4lpm pulsed___  Lap1 (185 feet)   HR__97___   02 Sat__86%4lpm pulsed- switched pt to 4lpm continuous and o2 sat increased to 90% Lap2 (185 feet)   HR__100___   02 Sat__83%4lpm continuous ___    Lap3 (185 feet)   HR_____   02 Sat_____  ___Test Completed without Difficulty ___Test Stopped due to:   By: Vernie Murders    Physical Exam  Additional Exam:  In general this is a well-preserved robust healthy-appearing ambulatory white male in no acute distress on 02 wt 234 >236  June 02, 2010 > 235 July 21, 2010 > 239 August 31, 2010 > 228 November 04, 2010  HEENT: nl dentition, turbinates, and orophanx. Nl external ear canals without cough reflex Neck without JVD/Nodes/TM Lungs  bilateral insp  crackles with a few sqeaks and pops on insp as well RRR no s3 or murmur or increase in P2, no edema Abd soft and benign with nl excursion in the supine position. No bruits or organomegaly Ext warm without calf tenderness, cyanosis but very subtle clubbing is present Skin warm and dry without lesions      Iron                      44 ug/dL                    16-109   Transferrin               295.2 mg/dL                 604.5-409.8   Iron Saturation      [L]  10.6 %                      20.0-50.0  Tests: (2) CBC Platelet w/Diff (CBCD)   White Cell Count          6.4 K/uL                    4.5-10.5   Red Cell Count            4.42 Mil/uL                 4.22-5.81   Hemoglobin           [L]  12.5  g/dL                   16.1-09.6   Hematocrit           [L]  37.1 %                      39.0-52.0   MCV                       83.9 fl                     78.0-100.0   MCHC                      33.8 g/dL                   04.5-40.9   RDW                  [H]   15.4 %                      11.5-14.6   Platelet Count            264.0 K/uL                  150.0-400.0   Neutrophil %              60.7 %                      43.0-77.0   Lymphocyte %              27.4 %                      12.0-46.0   Monocyte %                6.8 %                       3.0-12.0   Eosinophils%              4.7 %                       0.0-5.0   Basophils %               0.4 %                       0.0-3.0   Neutrophill Absolute      3.9 K/uL                    1.4-7.7   Lymphocyte Absolute       1.7 K/uL                    0.7-4.0   Monocyte Absolute         0.4 K/uL                    0.1-1.0  Eosinophils, Absolute                             0.3 K/uL                    0.0-0.7   Basophils Absolute  0.0 K/uL                    0.0-0.1  Tests: (3) BMP (METABOL)   Sodium                    141 mEq/L                   135-145   Potassium                 4.7 mEq/L                   3.5-5.1   Chloride                  101 mEq/L                   96-112   Carbon Dioxide       [H]  35 mEq/L                    19-32   Glucose                   91 mg/dL                    95-28   BUN                       14 mg/dL                    4-13   Creatinine                0.9 mg/dL                   2.4-4.0   Calcium                   9.2 mg/dL                   1.0-27.2   GFR                       83.25 mL/min                >60.00  CXR  Procedure date:  11/04/2010  Findings:        Comparison: Chest x-ray of 06/02/2010   Findings: Coarse and prominent interstitial markings throughout the lungs are consistent with pulmonary fibrosis and appear stable.  No active infiltrate or effusion is seen.  The heart is within upper limits of normal.  There are degenerative changes throughout the carina.   IMPRESSION: No significant change in pulmonary fibrosis.  No definite active process.  Impression & Recommendations:  Problem # 1:  PULMONARY FIBROSIS  (ICD-515) turned down for trial, no elevation of esr to suggest active inflammatory component  I had an extended discussion with the patient today lasting 15 to 20 minutes of a 25 minute visit on the following issues:  Use of PPI is associated with improved survival time and with decreased radiologic fibrosis per King's study published in AJRCCM vol 184 p1390.  This may not be cause and effect, but given how universally unhelpful all the otherstudy drugs have been for pf,   rec  continue max ppi plus add h2hs diet/ lifestyle modification.    Problem # 2:  RESPIRATORY FAILURE, CHRONIC (ICD-518.83)  desats on  pulsed 4lpm but wants to continue with the concentrator for now      Medications Added to Medication List This Visit: 1)  Slow Release Iron 140 (45 Fe) Mg Cr-tabs (Ferrous sulfate dried) .Marland Kitchen.. 1 once daily 2)  Pepcid 20 Mg Tabs (Famotidine) .... Take one by mouth at bedtime  Other Orders: Est. Patient Level IV (99214) T-2 View CXR (71020TC) TLB-IBC Pnl (Iron/FE;Transferrin) (83550-IBC) TLB-CBC Platelet - w/Differential (85025-CBCD) TLB-BMP (Basic Metabolic Panel-BMET) (80048-METABOL) Pulse Oximetry, Ambulatory (01093)  Patient Instructions: 1)  Add pepcid 20 mg one at bedtime 2)  GERD (REFLUX)  is a common cause of respiratory symptoms(and contribute to fibrosis) . It commonly presents without heartburn and can be treated with medication, but also with lifestyle changes including avoidance of late meals, excessive alcohol, smoking cessation, and avoid fatty foods, chocolate, peppermint, colas, red wine, and acidic juices such as orange juice. NO MINT OR MENTHOL PRODUCTS SO NO COUGH DROPS  3)  USE SUGARLESS CANDY INSTEAD (jolley ranchers)  4)  NO OIL BASED VITAMINS  5)  Return to office in 3 months, sooner if needed  6)  Continue 2lpm at rest and 4lpm with activity but pace yourself 7)  Incentive spirometry: use it at least 4 x daily

## 2010-11-19 NOTE — Progress Notes (Signed)
Summary: should pt take iron?  Phone Note Call from Patient Call back at Home Phone [phone]   Caller: Patient Call For: Cindee Salt MD Summary of Call: Pt states his oxygen level dropped to 79 last night while he was walking up steps.  He sat down and it went back up to about 90,  after 10 minutes.  He was told that he was borderline anemic after last labs and he is asking if taking iron would help his oxygen level.  If so, should he get a script or take otc?  Initial call taken by: Lowella Petties CMA, AAMA,  October 15, 2010 8:42 AM  Follow-up for Phone Call        No, there is no indication that he is iron deficient and if he eats okay he should not need extra iron Follow-up by: Cindee Salt MD,  October 15, 2010 12:48 PM  Additional Follow-up for Phone Call Additional follow up Details #1::        pt would like to know what makes him anemic then? pt states his sister had a transfusion and it gave her "stamina" back? please advise. DeShannon Smith CMA Duncan Dull)  October 15, 2010 4:57 PM   Many people with chronic disease (like pulmonary fibrosis) can have mild anemias like his. The blood count doesn't suggest iron deficiency but if he would like to try a daily OTC iron tab for 4-6 weeks and then come in for repeat blood test---that would be fine.  Transfusion is not indicated with his borderline low anemia (if he starts iron, set up CBC with diff, Fe, ferritin, TIBC) Cindee Salt MD  October 15, 2010 5:39 PM   spoke with pt, he will start iron, but he would like to do his lab work at his 11/12/2009 appt. DeShannon Smith CMA Duncan Dull)  October 16, 2010 2:41 PM   That is fine Additional Follow-up by: Cindee Salt MD,  October 19, 2010 9:17 AM

## 2010-11-19 NOTE — Assessment & Plan Note (Signed)
Summary: FOLLOW UP / LFW   Vital Signs:  Patient profile:   77 year old male Weight:      230 pounds O2 Sat:      90 % on 2 L/min Temp:     98.9 degrees F oral Pulse rate:   86 / minute Pulse rhythm:   regular BP sitting:   110 / 50  (left arm) Cuff size:   large  Vitals Entered By: Mervin Hack CMA Duncan Dull) (November 12, 2010 3:34 PM)  O2 Flow:  2 L/min CC: 4 month follow-up   History of Present Illness: DOing okay  Cardiologist noted some change in aortic stenosis, but apparently not striking Pulmonary pressures up some--presumably related to fibrosis  Breathing is still quite limited tries to do the treadmill--has only gotten as far as being able to do 5 minutes before resting Able to go out shopping---walks in stores okay SOme light housework Still has regular cough--uses sugar free candies  No chest pain--does get some pressure along lower sternum with deep breath No edema No palpitations  No problems with arthritis pain using less analgesics of late  Neuropathy has progressed slightly some trouble with the pedals on the piano  Allergies: 1)  ! Penicillin 2)  ! Sulfa  Past History:  Past medical, surgical, family and social histories (including risk factors) reviewed for relevance to current acute and chronic problems.  Past Medical History: Reviewed history from 11/04/2010 and no changes required. Allergic rhinitis Colonic polyps, hx of--tubular adenoma Diverticulosis, colon Gout Hypertension Osteoarthritis--hands, hips Pulmonary fibrosis-----------------------------------------Dr Wert       - PFT's 01/01/07 VC 56%  DLC0 42% not corrected for anemia     - PFT's 07/20/10 VC 48%  DLC 0 41%      - 02 dep since June 2011     - Review for Perfenidone trial August 31, 2010 >>> VC below 50%, does not qualify      -Desats on pulsed 02 a@  4lpm November 04, 2010 x one lap Hyperlipidemia GERD Aortic  stenosis---------------------------------------------Dr Gwen Pounds       - sp avr 2005 Carotid stenosis  Past Surgical History: Reviewed history from 08/23/2008 and no changes required. AVR (BOVINE ) [facility]  03/2004 CARDIAC CATH-- NO SIG. 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 Dad ided @87  lung disease, ?MI Mom died @58  ?valve disease, thyroid 1 sister with breast cancer No HTN DM in Mom, mat GM No prostate or colon cancer Dad had melanoma  Social History: Reviewed history from 11/24/2007 and no changes required. Marital Status: Married Children: 1 Daughter in [place] Retired Tax inspector organist Former Smoker quit in [phone]  with no respiratory complaint Alcohol use-yes  Review of Systems       appetite is pretty good weight up just slightly sleeps well On continuous oxygen and this really helps  Physical Exam  General:  alert and normal appearance.   On oxygen Neck:  supple, no masses, no thyromegaly, and no cervical lymphadenopathy.   Lungs:  normal respiratory effort, no intercostal retractions, and no accessory muscle use.  Moderate bibasilar dry crackles Heart:  normal rate, regular rhythm, and no gallop.   Soft aortic systolic murmur Abdomen:  soft, non-tender, and no masses.   Msk:  no joint tenderness and no joint swelling.   Extremities:  no sig edema Psych:  normally interactive, good eye contact, not anxious appearing, and not depressed appearing.     Impression & Recommendations:  Problem # 1:  AORTIC STENOSIS (ICD-424.1) Assessment Comment Only stable status dyspnea due to pulmonary disease  His updated medication list for this problem includes:    Adult Aspirin Low Strength 81 Mg Tbdp (Aspirin) .Marland Kitchen... Take 1 tablet by mouth once a day  Problem # 2:  NEUROPATHY  (ICD-355.9) Assessment: Deteriorated sllight worsening but not enough to warrant meds  Problem # 3:  PULMONARY FIBROSIS (ICD-515) Assessment: Comment Only limiting factor for him but no major recent functional changes  Problem # 4:  GERD (ICD-530.81) Assessment: Unchanged  aggressive Rx to prevent effect on pulmonary disease  His updated medication list for this problem includes:    Prilosec 20 Mg Cpdr (Omeprazole) .Marland Kitchen... Take one 30-60 min before first and last meals of the day    Pepcid 20 Mg Tabs (Famotidine) .Marland Kitchen... Take one by mouth at bedtime  Orders: TLB-Renal Function Panel (80069-RENAL) TLB-CBC Platelet - w/Differential (85025-CBCD) Venipuncture (78295)  Problem # 5:  OSTEOARTHRITIS (ICD-715.90) Assessment: Unchanged fair control with meds  His updated medication list for this problem includes:    Etodolac 500 Mg Tabs (Etodolac) .Marland Kitchen... Take 1 by mouth once daily    Tramadol Hcl 50 Mg Tabs (Tramadol hcl) .Marland Kitchen... Take 1 tablet by mouth up to three times a day as needed for pain    Adult Aspirin Low Strength 81 Mg Tbdp (Aspirin) .Marland Kitchen... Take 1 tablet by mouth once a day  Problem # 6:  HYPERLIPIDEMIA (ICD-272.4) Assessment: Unchanged  no problems will recheck  His updated medication list for this problem includes:    Pravastatin Sodium 40 Mg Tabs (Pravastatin sodium) .Marland Kitchen... Take one by mouth daily  Labs Reviewed: SGOT: 20 (07/10/2010)   SGPT: 16 (07/10/2010)   HDL:21.10 (11/06/2009), 22.30 (02/26/2009)  LDL:DEL (06/18/2008), 66 (62/13/0865)  Chol:159 (11/06/2009), 160 (02/26/2009)  Trig:226.0 (11/06/2009), 247.0 (02/26/2009)  Orders: TLB-Lipid Panel (80061-LIPID) TLB-Hepatic/Liver Function Pnl (80076-HEPATIC)  Complete Medication List: 1)  Prilosec 20 Mg Cpdr (Omeprazole) .... Take one 30-60 min before first and last meals of the day 2)  Etodolac 500 Mg Tabs (Etodolac) .... Take 1 by mouth once daily 3)  Pravastatin Sodium 40 Mg Tabs (Pravastatin sodium) .... Take one  by mouth daily 4)  Colchicine 0.6 Mg Tabs (Colchicine) .... Take 1 tablet by mouth once daily to prevent gout 5)  Tramadol Hcl 50 Mg Tabs (Tramadol hcl) .... Take 1 tablet by mouth up to three times a day as needed for pain 6)  Pepcid 20 Mg Tabs (Famotidine) .... Take one by mouth at bedtime 7)  Adult Aspirin Low Strength 81 Mg Tbdp (Aspirin) .... Take 1 tablet by mouth once a day 8)  Zyrtec 10 Mg Tabs (Cetirizine hcl) .... Take 1 tablet by mouth once a day 9)  Vitamin D3 1000 Unit Tabs (Cholecalciferol) .... Take 1 by mouth once daily 10)  Vitamin B-12 1000 Mcg Tabs (Cyanocobalamin) .... Take 1 by mouth once daily 11)  Multivitamins Tabs (Multiple vitamin) .... Take 1 tablet by mouth once a day 12)  Lutein Tabs (Lutein tabs) .... Take 1 tablet by mouth once a day 13)  Oxygen  .... Wear 2l/min 24 h per day increase to 4lpm with ex 14)  Slow Release Iron 140 (45 Fe) Mg Cr-tabs (Ferrous sulfate dried) .Marland Kitchen.. 1 once daily  Patient Instructions: 1)  Please schedule a follow-up appointment in 4 months .  Orders Added: 1)  Est. Patient Level IV [93235] 2)  TLB-Renal Function Panel [80069-RENAL] 3)  TLB-CBC Platelet - w/Differential [85025-CBCD] 4)  Venipuncture [36415] 5)  TLB-Lipid Panel [80061-LIPID] 6)  TLB-Hepatic/Liver Function Pnl [80076-HEPATIC]    Current Allergies (reviewed today): ! PENICILLIN ! SULFA

## 2010-11-19 NOTE — Letter (Signed)
Summary: [facility] - Cardiology / Recheck / Dr Arnoldo Hooker  [facility] - Cardiology / Recheck / dr. Kerby Moors   Imported By: Carin Primrose 11/13/2010 16:25:35  _____________________________________________________________________  External Attachment:    Type:   Image     Comment:   External Document  Appended Document: [facility] - Cardiology / Recheck / Dr Arnoldo Hooker stopped bisoprolol

## 2010-11-19 NOTE — Letter (Signed)
Summary: CMN for Oxygen/[facility]  CMN for Oxygen/[facility]   Imported By: Lanelle Bal 10/01/2010 15:47:10  _____________________________________________________________________  External Attachment:    Type:   Image     Comment:   External Document

## 2010-12-09 ENCOUNTER — Encounter: Payer: Self-pay | Admitting: Internal Medicine

## 2010-12-24 NOTE — Miscellaneous (Signed)
Summary: Order for Oxygen/[facility]  Order for Oxygen/[facility]   Imported By: Maryln Gottron 12/14/2010 10:49:50  _____________________________________________________________________  External Attachment:    Type:   Image     Comment:   External Document

## 2011-01-09 ENCOUNTER — Other Ambulatory Visit: Payer: Self-pay | Admitting: Internal Medicine

## 2011-01-11 ENCOUNTER — Other Ambulatory Visit: Payer: Self-pay | Admitting: Internal Medicine

## 2011-03-02 NOTE — Assessment & Plan Note (Signed)
[facility]                             PULMONARY OFFICE NOTE   NAME:WOODINGSCristin, Szatkowski                       MRN:          [id number]  DATE:03/01/2007                            DOB:          25-Feb-1934    HISTORY:  This is a 77 year old white male with newly diagnosed  pulmonary fibrosis of undetermined etiology who tells me today that he  has a four years younger sister that has the same problem. He wondered  if he could have prednisone like she does for emergencies.   The patient actually is all smiles again today having no symptoms at all  of dyspnea or cough since we started him on high dose PPI therapy. He  denies any pleuritic pain, fevers, chills, sweats, orthopnea, PND, leg  swelling, or variability in terms of his symptoms.   MEDICATIONS:  Reviewed with the patient in detail in the column dated  03/01/2007.   PHYSICAL EXAMINATION:  GENERAL:  He is a pleasant ambulatory, slightly  pale, white male in no acute distress.  VITAL SIGNS:  Stable.  HEENT:  Unremarkable. Oral pharynx clear.  LUNGS:  Completely clear bilaterally to auscultation and percussion.  There were a few crackles on inspiration bilaterally with no associated  cough.  HEART:  Regular rate and rhythm without murmur, gallop, or rub.  ABDOMEN:  Soft and benign.  EXTREMITIES:  Warm without calf tenderness, cyanosis, clubbing, or  edema.   We ambulated the patient around the office. This time he made it 3 full  laps, 185 feet each, and maintained saturations above 89. He talked the  entire test and walked it at a brisk pace with a peak heart rate of only  78.   IMPRESSION:  Follow up will be in three months with another set of PFTs  for apple-to-apple comparison to previous x-rays. We will also need to  ambulate him on each visit to compare the previous studies.   PLAN:  There is no indication for a emergency supply of prednisone  given the fact that he has a bland fibrosis  pattern on chest x-ray and  by sedimentation rate. I am concerned, however, about his anemia  contributing to exercise desaturation and intolerance and have strongly  recommended that he, Dr. Alphonsus Sias, and Dr. Russella Dar make sure that his anemia  is corrected to the extent possible with a GI workup and iron  supplementation as needed.     Charlaine Dalton. [name], MD, [facility]    MBW/MedQ  DD: 03/01/2007  DT: 03/02/2007  Job #: [id number]   cc:   Karie Schwalbe, MD

## 2011-03-02 NOTE — Assessment & Plan Note (Signed)
[facility]                         GASTROENTEROLOGY OFFICE NOTE   NAME:WOODINGSCriss, [name]                       MRN:          [id number]  DATE:02/28/2007                            DOB:          Oct 15, 1934    REASON FOR REFERRAL:  Iron deficiency anemia.   HISTORY OF PRESENT ILLNESS:  Brett [name] is a very nice 77 year old  white [name], referred through the courtesy of Dr. Tillman Abide.  He  relates a history of iron use in 2005, around the time of his aortic  valve replacement for aortic stenosis.  He was recently found to have a  microcytic anemia on routine blood work from January 25, 2007.  Hemoglobin  was 12.3, MCV 71.6, and ferritin was 6.9.  He has chronic GERD and he  has generally used Prilosec on a daily basis for several years.  This  has recently increased to twice a day by Dr. [name] due to pulmonary  fibrosis.  A recent chest CT scan showed a normal appearance in the  upper abdomen.  He has noted some occasional problems with hemorrhoids  over the years, but he denies any ongoing melena, hematochezia, change  in stool caliber, diarrhea, constipation, dysphagia, odynophagia,  nausea, vomiting, or weight loss.  He states he had 2 prior  colonoscopies by Dr. Maryruth Bun.  His most recent colonoscopy was in May of  2001 that showed diverticulosis.  He has not previously had upper  endoscopy.  No family history of colon cancer, colon polyps, or  inflammatory bowel disease.  He had a recent cardiac evaluation by Dr.  Gwen Pounds in March and he states everything is stable.   PAST MEDICAL HISTORY:  1. Aortic stenosis status post aortic valve replacement, June 2005.  2. Gastroesophageal reflux disease.  3. Kidney stones.  4. Diverticulosis.  5. Diverticulitis.  6. Hyperlipidemia.  7. Hypertension.  8. Pulmonary fibrosis.  9. Allergic rhinitis.  10.Unspecified cardiac rhythm problems.   CURRENT MEDICATIONS:  Listed on the chart, updated and  reviewed.   MEDICATIONS ALLERGIES:  PENICILLIN and possibly to SULFA DRUGS.   SOCIAL HISTORY/REVIEW OF SYSTEMS:  Per the handwritten form.   PHYSICAL EXAMINATION:  Overweight white [name] in no acute distress.  Height 5 feet 11 inches, weight 244 pounds.  Blood pressure is 92/60,  pulse 60 and regular.  HEENT EXAM:  Anicteric sclerae.  Oropharynx clear.  CHEST:  Inspiratory crackles in both bases.  CARDIAC:  Irreg. rhythm with prosthetic valve sounds noted.  No murmurs  appreciated.  ABDOMEN:  Soft, nontender, nondistended.  Normoactive bowel sounds.  No  palpable organomegaly, masses, or hernias.  RECTAL EXAMINATION:  Deferred to time of colonoscopy.  EXTREMITIES:  Without cyanosis, clubbing, or edema.  NEUROLOGIC:  Alert and oriented x3.  Grossly nonfocal.   ASSESSMENT AND PLAN:  1. Iron deficiency anemia. History of chronic etodolac usage. Chronic      gastroesophageal reflux disease, rule out occult gastrointestinal      bleeding.  Obtain stool hemoccults.  Obtain serologies for celiac      disease.  Risks, benefits, and alternatives to colonoscopy with  possible biopsy and possible polypectomy and upper endoscopy with      possible biopsy discussed with the patient and he consents to      proceed.  The procedures will be scheduled electively.  He will      remain on standard antireflux measures, and Prilosec 20 mg p.o.      b.i.d.  He will remain off non-steroidal anti-inflammatory drugs      for now.  He will continue on aspirin 81 mg a day.     [name]. Russella Dar, MD, [facility]  Electronically Signed    MTS/MedQ  DD: 02/28/2007  DT: 02/28/2007  Job #: [id number]   cc:   Karie Schwalbe, MD

## 2011-03-04 ENCOUNTER — Encounter: Payer: Self-pay | Admitting: Internal Medicine

## 2011-03-05 NOTE — Assessment & Plan Note (Signed)
[facility]                             PULMONARY OFFICE NOTE   NAME:Brett [name], Brett [name]                       MRN:          [id number]  DATE:01/11/2007                            DOB:          May 29, 1934    PULMONARY FOLLOWUP OFFICE VISIT:   HISTORY:  A 77 year old male who comes in all smiles and feeling much  better after increasing Omeprazole to b.i.d. dosing and instructing him  on optimal treatment directed at GERD.  Both his cough and dyspnea are  markedly improved.  He comes back as requested for a set of PFT's and  brings all of his old x-rays showing a progressive pattern of fibrosis  that was not present on the previous film dated 2005.   Presently, the patient has no significant limitations, but has been  physically very inactive.  He denies any significant dyspnea with  exertion, fevers, chills, sweats, orthopnea, PND, leg swelling of cough.   MEDICATIONS:  His medications were reviewed in detail on the worksheet,  noting that he is taking etodolac 5 mg b.i.d. plus a baby aspirin daily.  He denies any GI blood loss.   PHYSICAL EXAMINATION:  GENERAL:  He is a somewhat pale, ambulatory white  man in no acute distress.  VITAL SIGNS:  Stable.  HEENT:  Unremarkable.  Oropharynx clear.  LUNGS:  Lung fields reveal classic inspiratory crackles in the bases  bilaterally with no cough elicited on inspiratory maneuvers.  HEART:  Regular rhythm without murmur, gallop, or rub.  ABDOMEN:  Soft, benign.  EXTREMITIES:  Warm without calf tenderness.  No cyanosis or clubbing.   X-RAYS:  As above.   PULMONARY FUNCTION TESTS:  Moderate reduction in lung volumes with a  reduction from 67% predicted a year ago, now down to 56%.  Of concern  also, his diffusion capacity has dropped from 68% to 42%.  However, the  chart indicates he is also anemic with microcytic indices (I am not sure  what his hemoglobin was when his previous diffusion capacity was  checked, but of course this does directly affect the diffusion  capacity).  At present, his hematocrit is 35%, but his MCV is only 71.  Sedimentation rate is 19 and BNP was 288.   IMPRESSION:  Parke Simmers pulmonary fibrosis occurring in a patient with no  clubbing, with marked improvement in cough and dyspnea with institution  of high dose proton pump inhibitor therapy along with anti-reflux diet.  The good new is that we have improved his symptoms.  The bad news is  that with the anemia and/or iron deficiency, any amount of decreasing  diffusion capacity, or for that matter diffusion block will be  accentuated because of the effect on venous 02 saturations.   Since his pulmonary symptoms are relatively stable at present, and there  is no marked elevation in sedimentation rate, I do not feel any urge to  treat him with any systemic agents (actually there are no approved  agents for this type of bland fibrosis anyway) and instead will refer  him back to Dr. Alphonsus Sias for consideration for  work-up of the microcytic  anemia.   In the meantime, I have recommended the patient cut back on the etodolac  to p.r.n. dosing, using Tylenol for arthritis in case there is occult GI  bleeding occurring.     Charlaine Dalton. [name], MD, [facility]  Electronically Signed    MBW/MedQ  DD: 01/11/2007  DT: 01/11/2007  Job #: [id number]

## 2011-03-05 NOTE — Assessment & Plan Note (Signed)
[facility]                             PULMONARY OFFICE NOTE   NAME:WOODINGSShakeem, Stern                       MRN:          [id number]  DATE:12/06/2006                            DOB:          07-11-1934    PULMONARY CONSULTATION   CHIEF COMPLAINT:  1. Dyspnea and cough.  2. He does have slight elevation of BNP.  3. Slightly anemic with microcytic features.  I do not think he has      significant heart failure, although an echocardiogram probably      should be obtained, if not already done so by Dr. Alphonsus Sias, and I      will defer to Dr. Alphonsus Sias the workup of the microcytic anemia.   HISTORY:  This is a 77 year old white male, remote smoker with incipient  onset of progressively worsening dyspnea and cough dating back about 5  years.  His daughter said the first thing she noticed that he had to  clear his throat more frequently, but he denied this being an issue  until approximately a year ago when he noted, associated with the  cough progressive dyspnea on exertion to the point where he is having  trouble with anything more than short walks.  He had difficulty, for  instance, going up a flight of steps or doing yard work.  He denies any  exertional chest pain, orthopnea, PND, or leg swelling, or variability  in terms of his dyspnea or cough, or obvious environmental or weather  triggers.   The patient denies any history of rheumatologic disease or previous  exposure to chemotherapy or amiodarone, or Macrodantin.   PAST MEDICAL HISTORY:  Significant for cardiac rhythm problems.  He is  status post aortic valve surgery in June of 2005.   ALLERGIES:  None known.   MEDICATIONS:  Omeprazole.  Colchicine.  Etodolac.  Zyrtec.  Stool  softeners.  Multiple vitamins.  Ziac.  Aspirin.  Singulair.   SOCIAL HISTORY:  He quit smoking in the mid 1960s.  He is a retired  Programmer, systems.  Did work at [employer] in the summer in college, but this was  his only  industrial exposure.   FAMILY HISTORY:  Significant for the absence of respiratory disease.   REVIEW OF SYSTEMS:  Taken in detail on the worksheet and negative,  except as outlined above.   PHYSICAL EXAMINATION:  This is a pleasant, ambulatory, obese white male  in no acute distress.  He, indeed, does clear his throat frequently  during the exam.  He appeared a bit pasty and pale today, but his  daughter says that this is his usual color.  VITAL SIGNS:  Stable vital signs.  HEENT:  Unremarkable.  Oropharynx is clear.  Dentition intact.  There is  no evidence of excessive post-nasal drainage or cobblestoning.  NECK:  Supple without cervical adenopathy or tenderness. Trachea is  midline.  No thyromegaly.  LUNGS:  Fields reveal a few crackles in the bases only.  These are quite  subtle.  There is no inspiratory or expiratory wheezing or cough.  CARDIAC:  Regular rate  and rhythm without murmur, gallop, or rub.  ABDOMEN:  Soft and benign.  EXTREMITIES:  Warm without calf tenderness, cyanosis, clubbing, or  edema.   Chest x-rays from [name] office suggest mild cardiomegaly and  increased interstitial markings.  Lab data today indicated a BNP of 288  with a sed rate of 19, hematocrit of 35.7, and an MCV of 71.   We walked him around the office for 1-and-a-half laps before he  desaturated to 86%.   CT scan of the chest at [facility] done this past week shows  pulmonary fibrosis with nonspecific features.   IMPRESSION:  This patient most likely has a very mild pulmonary fibrosis  dating back several years, but in the absence of clubbing, and given the  amount of throat-clearing he is doing, he may have enough  gastroesophageal reflux disease as a primary problem to suggest that a  unifying diagnosis for all of his problems is actually occult gastric  aspiration.  I, therefore, recommended increasing proton pump inhibitor  therapy to b.i.d., taken before his first and last  meals, treat the  cough with Delsym, supplement with tramadol, and return here for a full  set of PFTs and also to repeat a walking sat to see if he desaturates.  In the meantime, I have asked him to ambulate at a level below which he  did today for [place].     Charlaine Dalton. [name], MD, [facility]  Electronically Signed    MBW/MedQ  DD: 12/07/2006  DT: 12/07/2006  Job #: [id number]   cc:   Karie Schwalbe, MD

## 2011-03-08 ENCOUNTER — Ambulatory Visit (INDEPENDENT_AMBULATORY_CARE_PROVIDER_SITE_OTHER): Payer: Medicare Other | Admitting: Internal Medicine

## 2011-03-08 ENCOUNTER — Encounter: Payer: Self-pay | Admitting: Internal Medicine

## 2011-03-08 VITALS — BP 118/66 | HR 76 | Temp 98.4°F | Wt 228.0 lb

## 2011-03-08 DIAGNOSIS — J8410 Pulmonary fibrosis, unspecified: Secondary | ICD-10-CM

## 2011-03-08 DIAGNOSIS — G589 Mononeuropathy, unspecified: Secondary | ICD-10-CM

## 2011-03-08 DIAGNOSIS — I359 Nonrheumatic aortic valve disorder, unspecified: Secondary | ICD-10-CM

## 2011-03-08 DIAGNOSIS — I10 Essential (primary) hypertension: Secondary | ICD-10-CM

## 2011-03-08 DIAGNOSIS — E785 Hyperlipidemia, unspecified: Secondary | ICD-10-CM

## 2011-03-08 NOTE — Progress Notes (Signed)
Subjective:    Patient ID: Brett [name], male    DOB: 1933/11/25, 77 y.o.   MRN: [id number]  HPI DOing okay Tries to get on treadmill regularly Does up to 4 reps with rests Has gotten up to 7 minutes at a time. Has done some chores in house Hard to garden--really can't bend over and work  No dizziness or chest pain No syncope No edema Stable exercise tolerance  Neuropathy persists Right foot is worse than left Able to play organ still  Current outpatient prescriptions:aspirin 81 MG tablet, Take 81 mg by mouth daily.  , Disp: , Rfl: ;  cetirizine (ZYRTEC) 10 MG tablet, Take 10 mg by mouth daily.  , Disp: , Rfl: ;  Cholecalciferol (VITAMIN D3) 1000 UNITS CAPS, Take by mouth daily.  , Disp: , Rfl: ;  colchicine 0.6 MG tablet, Take 0.6 mg by mouth daily.  , Disp: , Rfl: ;  etodolac (LODINE) 500 MG tablet, Take 500 mg by mouth daily.  , Disp: , Rfl:  famotidine (PEPCID) 20 MG tablet, Take 20 mg by mouth at bedtime.  , Disp: , Rfl: ;  Lutein 10 MG TABS, Take by mouth daily.  , Disp: , Rfl: ;  omeprazole (PRILOSEC) 20 MG capsule, TAKE ONE CAPSULE BY MOUTH TWICE A DAY, Disp: 60 capsule, Rfl: 12;  pravastatin (PRAVACHOL) 40 MG tablet, Take 40 mg by mouth daily.  , Disp: , Rfl:  traMADol (ULTRAM) 50 MG tablet, TAKE 1 TABLET BY MOUTH UP TO THREE TIMES A DAY AS NEEDED FOR PAIN, Disp: 90 tablet, Rfl: 1;  vitamin B-12 (CYANOCOBALAMIN) 1000 MCG tablet, Take 1,000 mcg by mouth daily.  , Disp: , Rfl: ;  Ferrous Sulfate (SLOW RELEASE IRON PO), Take by mouth daily.  , Disp: , Rfl:   Past Medical History  Diagnosis Date  . Allergy   . Hx of colonic polyps   . Diverticulitis   . Gout   . Hypertension   . Arthritis   . Hyperlipidemia   . GERD (gastroesophageal reflux disease)   . Pulmonary fibrosis   . Aortic stenosis   . Carotid stenosis   . Cataract     Past Surgical History  Procedure Date  . Anomalous pulmonary venous return repair   . Cardiac catheterization   . Melanoma excision    shoulder    Family History  Problem Relation Age of Onset  . Diabetes Mother   . Cancer Sister     breast  . Diabetes Maternal Grandmother     History   Social History  . Marital Status: Married    Spouse Name: N/A    Number of Children: 1  . Years of Education: N/A   Occupational History  . retired Proofreader    Social History Main Topics  . Smoking status: Former Games developer  . Smokeless tobacco: Never Used   Comment: quit in the 60's  . Alcohol Use: Yes     rarely  . Drug Use: No  . Sexually Active: Not on file   Other Topics Concern  . Not on file   Social History Narrative  . No narrative on file   Review of Systems Nocturia 2-3 per night. STable Sleeps well Appetite is fine Weight stable Mood not quite as level---gets upset easier now (than in the past)    Objective:   Physical Exam  Constitutional: He appears well-developed and well-nourished. No distress.       On oxygen  Neck: Normal range  of motion. Neck supple. No thyromegaly present.  Cardiovascular: Normal rate, regular rhythm and intact distal pulses.  Exam reveals no gallop.   Murmur heard.      Opening snap ?soft aortic systolic murmur  Pulmonary/Chest: Effort normal. No respiratory distress. He has no wheezes. He has rales.       Bronchial sounds with fine crackles at both bases  Musculoskeletal: Normal range of motion. He exhibits no edema and no tenderness.  Lymphadenopathy:    He has no cervical adenopathy.  Psychiatric: He has a normal mood and affect. His behavior is normal. Judgment and thought content normal.          Assessment & Plan:

## 2011-03-13 ENCOUNTER — Other Ambulatory Visit: Payer: Self-pay | Admitting: Internal Medicine

## 2011-03-13 NOTE — Telephone Encounter (Signed)
[facility] request electronic refill for Lodine 500mg . Pt was seen by Dr Alphonsus Sias on 03/08/11. Please advise.

## 2011-03-14 NOTE — Telephone Encounter (Signed)
Okay to fill for 1 year Can go electronically

## 2011-04-05 ENCOUNTER — Other Ambulatory Visit: Payer: Self-pay | Admitting: *Deleted

## 2011-04-05 MED ORDER — TRAMADOL HCL 50 MG PO TABS: 50.0000 mg | ORAL_TABLET | Freq: Three times a day (TID) | ORAL | Status: DC | PRN

## 2011-04-05 NOTE — Telephone Encounter (Signed)
rx sent to pharmacy by e-script  

## 2011-04-05 NOTE — Telephone Encounter (Signed)
Okay #90 x 1 

## 2011-06-15 ENCOUNTER — Other Ambulatory Visit: Payer: Self-pay | Admitting: *Deleted

## 2011-06-15 MED ORDER — TRAMADOL HCL 50 MG PO TABS: 50.0000 mg | ORAL_TABLET | Freq: Three times a day (TID) | ORAL | Status: DC | PRN

## 2011-06-15 NOTE — Telephone Encounter (Signed)
rx sent to pharmacy by e-script  

## 2011-06-28 ENCOUNTER — Telehealth: Payer: Self-pay | Admitting: *Deleted

## 2011-06-28 NOTE — Telephone Encounter (Signed)
Pt's sister is in [place] under hospice care and pt needs to fly out there.  He is concerned about going, because of the thin air.  That's how his breathing problems started last time, when [place] went out west, and had to start oxygen. He hasnt traveled with oxygen since . He is asking for your opinion on this.

## 2011-06-28 NOTE — Telephone Encounter (Signed)
The high altitude could have an effect Oxygen may help that if he really has to go He may want to check with Dr [name] to see if he will qualify for portable oxygen If he can't, it may be best to stay home

## 2011-06-28 NOTE — Telephone Encounter (Signed)
Spoke with patient and advised results   

## 2011-07-13 ENCOUNTER — Ambulatory Visit (INDEPENDENT_AMBULATORY_CARE_PROVIDER_SITE_OTHER): Payer: Medicare Other | Admitting: Internal Medicine

## 2011-07-13 ENCOUNTER — Encounter: Payer: Self-pay | Admitting: Internal Medicine

## 2011-07-13 VITALS — BP 124/60 | HR 86 | Temp 99.3°F | Ht 71.0 in | Wt 220.0 lb

## 2011-07-13 DIAGNOSIS — Z23 Encounter for immunization: Secondary | ICD-10-CM

## 2011-07-13 DIAGNOSIS — J8410 Pulmonary fibrosis, unspecified: Secondary | ICD-10-CM

## 2011-07-13 DIAGNOSIS — E785 Hyperlipidemia, unspecified: Secondary | ICD-10-CM

## 2011-07-13 DIAGNOSIS — I10 Essential (primary) hypertension: Secondary | ICD-10-CM

## 2011-07-13 DIAGNOSIS — M1990 Unspecified osteoarthritis, unspecified site: Secondary | ICD-10-CM

## 2011-07-13 DIAGNOSIS — G589 Mononeuropathy, unspecified: Secondary | ICD-10-CM

## 2011-07-13 DIAGNOSIS — K219 Gastro-esophageal reflux disease without esophagitis: Secondary | ICD-10-CM

## 2011-07-13 LAB — BASIC METABOLIC PANEL
BUN: 16 mg/dL (ref 6–23)
Chloride: 102 mEq/L (ref 96–112)
Creatinine, Ser: 0.8 mg/dL (ref 0.4–1.5)
Glucose, Bld: 85 mg/dL (ref 70–99)
Potassium: 4.4 mEq/L (ref 3.5–5.1)

## 2011-07-13 LAB — CBC WITH DIFFERENTIAL/PLATELET
Basophils Absolute: 0 10*3/uL (ref 0.0–0.1)
Eosinophils Absolute: 0.2 10*3/uL (ref 0.0–0.7)
Eosinophils Relative: 4.1 % (ref 0.0–5.0)
HCT: 37 % — ABNORMAL LOW (ref 39.0–52.0)
Lymphs Abs: 1.5 10*3/uL (ref 0.7–4.0)
MCHC: 33 g/dL (ref 30.0–36.0)
MCV: 87.1 fl (ref 78.0–100.0)
Monocytes Absolute: 0.3 10*3/uL (ref 0.1–1.0)
Neutrophils Relative %: 61 % (ref 43.0–77.0)
Platelets: 190 10*3/uL (ref 150.0–400.0)
RDW: 15.7 % — ABNORMAL HIGH (ref 11.5–14.6)
WBC: 5.2 10*3/uL (ref 4.5–10.5)

## 2011-07-13 LAB — LIPID PANEL
Cholesterol: 148 mg/dL (ref 0–200)
LDL Cholesterol: 84 mg/dL (ref 0–99)
Triglycerides: 169 mg/dL — ABNORMAL HIGH (ref 0.0–149.0)

## 2011-07-13 LAB — HEPATIC FUNCTION PANEL
AST: 18 U/L (ref 0–37)
Albumin: 3.9 g/dL (ref 3.5–5.2)
Total Bilirubin: 0.6 mg/dL (ref 0.3–1.2)

## 2011-07-13 LAB — TSH: TSH: 0.67 u[IU]/mL (ref 0.35–5.50)

## 2011-07-13 NOTE — Assessment & Plan Note (Signed)
Lab Results  Component Value Date   LDLCALC 66 11/10/2007   Good control Due for labs Last LDL direct was ~110

## 2011-07-13 NOTE — Progress Notes (Signed)
Subjective:    Patient ID: Brett [name], male    DOB: [birth year], 77 y.o.   MRN: [id number]  HPI Doing okay Sister recently died in [place]---he didn't go due to the high altitude  Breathing has been stable Continues on oxygen---feels he did okay one night when the machine was off Has slacked off on his exercise due to the heat (treadmill on the back porch) He doesn't notice any change in exercise tolerance Still with chronic dry cough  No chest pain  No palpitaitons  Foot numbness continues Mostly on right but somewhat on left Trouble with organ pedals now  Arthritis pain hasn't been bad Uses the etodolac once a day Uses the tramadol since it helps pain and cough (as much as three a day)  No active reflux on the med Heartburn is controlled  Current Outpatient Prescriptions on File Prior to Visit  Medication Sig Dispense Refill  . aspirin 81 MG tablet Take 81 mg by mouth daily.        . cetirizine (ZYRTEC) 10 MG tablet Take 10 mg by mouth daily.        . Cholecalciferol (VITAMIN D3) 1000 UNITS CAPS Take by mouth daily.        . colchicine 0.6 MG tablet Take 0.6 mg by mouth daily.        Marland Kitchen etodolac (LODINE) 500 MG tablet TAKE 1 TABLET DAILY  90 tablet  2  . famotidine (PEPCID) 20 MG tablet Take 20 mg by mouth at bedtime.        . Ferrous Sulfate (SLOW RELEASE IRON PO) Take by mouth daily.        . Lutein 10 MG TABS Take by mouth daily.        Marland Kitchen omeprazole (PRILOSEC) 20 MG capsule TAKE ONE CAPSULE BY MOUTH TWICE A DAY  60 capsule  12  . pravastatin (PRAVACHOL) 40 MG tablet Take 40 mg by mouth daily.        . traMADol (ULTRAM) 50 MG tablet Take 1 tablet (50 mg total) by mouth every 8 (eight) hours as needed for pain.  90 tablet  0  . vitamin B-12 (CYANOCOBALAMIN) 1000 MCG tablet Take 1,000 mcg by mouth daily.          Allergies  Allergen Reactions  . Penicillins   . Sulfonamide Derivatives     REACTION: itching    Past Medical History  Diagnosis Date  . Allergy   .  Hx of colonic polyps   . Diverticulitis   . Gout   . Hypertension   . Arthritis   . Hyperlipidemia   . GERD (gastroesophageal reflux disease)   . Pulmonary fibrosis   . Aortic stenosis   . Carotid stenosis   . Cataract     Past Surgical History  Procedure Date  . Anomalous pulmonary venous return repair   . Cardiac catheterization   . Melanoma excision     shoulder    Family History  Problem Relation Age of Onset  . Diabetes Mother   . Cancer Sister     breast  . Diabetes Maternal Grandmother     History   Social History  . Marital Status: Married    Spouse Name: N/A    Number of Children: 1  . Years of Education: N/A   Occupational History  . retired Proofreader    Social History Main Topics  . Smoking status: Former Games developer  . Smokeless tobacco: Never Used   Comment: quit  in the 60's  . Alcohol Use: Yes     rarely  . Drug Use: No  . Sexually Active: Not on file   Other Topics Concern  . Not on file   Social History Narrative  . No narrative on file   Review of Systems Sleeps fair--has been affected by sister's death though Still stress with prolongation of daugther's divorce. She needs financial help Appetite is okay Weight fairly stable     Objective:   Physical Exam  Constitutional: He appears well-developed and well-nourished. No distress.  Neck: Normal range of motion. Neck supple. No thyromegaly present.  Cardiovascular: Normal rate, regular rhythm and intact distal pulses.  Exam reveals no gallop.   No murmur heard.      Valve click  Pulmonary/Chest: Effort normal. No respiratory distress. He has no wheezes. He has rales.       Bibasilar coalescent fine crackles 1/3rd up  Abdominal: Soft. There is no tenderness.  Musculoskeletal: Normal range of motion. He exhibits no edema and no tenderness.  Lymphadenopathy:    He has no cervical adenopathy.  Psychiatric: He has a normal mood and affect. His behavior is normal.  Judgment and thought content normal.          Assessment & Plan:

## 2011-07-13 NOTE — Assessment & Plan Note (Signed)
Chronic oxygen use Needs to get back to exercising Tramadol helps chronic cough

## 2011-07-13 NOTE — Assessment & Plan Note (Signed)
Does okay with meds 

## 2011-07-13 NOTE — Assessment & Plan Note (Signed)
BP Readings from Last 3 Encounters:  07/13/11 124/60  03/08/11 118/66  11/12/10 110/50   Good control No changes needed Will check labs

## 2011-07-13 NOTE — Assessment & Plan Note (Signed)
Ongoing numbness No Rx appropriate

## 2011-07-13 NOTE — Assessment & Plan Note (Signed)
?  some symptoms Will continue meds

## 2011-07-16 ENCOUNTER — Encounter: Payer: Self-pay | Admitting: *Deleted

## 2011-07-26 ENCOUNTER — Other Ambulatory Visit: Payer: Self-pay | Admitting: Internal Medicine

## 2011-08-11 ENCOUNTER — Other Ambulatory Visit: Payer: Self-pay | Admitting: Internal Medicine

## 2011-09-04 ENCOUNTER — Other Ambulatory Visit: Payer: Self-pay | Admitting: Internal Medicine

## 2011-09-06 ENCOUNTER — Telehealth: Payer: Self-pay | Admitting: *Deleted

## 2011-09-06 DIAGNOSIS — J8410 Pulmonary fibrosis, unspecified: Secondary | ICD-10-CM

## 2011-09-06 NOTE — Telephone Encounter (Signed)
If that helps him feel like his breathing is okay---that is fine The pulmonologist can check saturations of oxygen at the visit

## 2011-09-06 NOTE — Telephone Encounter (Signed)
Referral made to Dr Kendrick Fries

## 2011-09-06 NOTE — Telephone Encounter (Signed)
Pt has appt to see pulmonologist on  11/28.  He says his oxygen settings are 2 litres while sitting, 3-4 when walking,and he usually has to keep it at 35. litres most anywhere he goes.  He is asking if ok to keep at these settings till he sees pulmo.  Please advise.

## 2011-09-06 NOTE — Telephone Encounter (Signed)
Pt states he is having breathing problems again and would like to be referred to pulomonologist in [place], that would be more convenient for him.  He doesn't want to go back to Dr. [name].  He says you are familiar with his breathing problems.

## 2011-09-06 NOTE — Telephone Encounter (Signed)
Spoke with patient and advised results   

## 2011-09-15 ENCOUNTER — Encounter: Payer: Self-pay | Admitting: Pulmonary Disease

## 2011-09-15 ENCOUNTER — Ambulatory Visit (INDEPENDENT_AMBULATORY_CARE_PROVIDER_SITE_OTHER): Payer: Medicare Other | Admitting: Pulmonary Disease

## 2011-09-15 DIAGNOSIS — J8410 Pulmonary fibrosis, unspecified: Secondary | ICD-10-CM

## 2011-09-15 DIAGNOSIS — R1310 Dysphagia, unspecified: Secondary | ICD-10-CM

## 2011-09-15 DIAGNOSIS — R059 Cough, unspecified: Secondary | ICD-10-CM | POA: Insufficient documentation

## 2011-09-15 DIAGNOSIS — R05 Cough: Secondary | ICD-10-CM

## 2011-09-15 NOTE — Assessment & Plan Note (Addendum)
His dry cough is associated with his progressive DPLD and reflux.  I heard nothing on today's history to suggest a change in his current therapy.

## 2011-09-15 NOTE — Patient Instructions (Signed)
We will send you for full PFT's and an upper GI/barium swallow to evaluate your esophagus at [facility].  We will send you for a CT chest (without contrast, ILD protocol) in [place].  Continue taking the tramadol for cough.    We will see you back in two weeks.

## 2011-09-15 NOTE — Assessment & Plan Note (Addendum)
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  DDx of his DPLD is likely recurrent chronic aspiration vs. non-specific interstitial pneumonitis (NSIP) vs. less likely UIP.  Review of his 2008 imaging is suggestive of chronic aspiration as he has bronchiectasis in the bases.  Further, he has little to suggest a systemic inflammatory process, though this is not always necessary in NSIP.    He has had a slow progressive decline in the last 10 years, and is now hypoxemic.  He tells me that his cardiologist has been pleased with imaging of his bovine aortic valve and [name] gives little history to suggest pulmonary edema/heart failure as a cause of his dyspnea.  Therefore I believe that his DPLD is progressing.  I explained to [name] that the only way to make a definitive diagnosis would be to perform an open lung biopsy.  The utility of this is debatable however considering [name]'s likely diagnosis of aspiration pneumonia, and this would be a high risk procedure considering his low performance status. I believe the best approach at this point is to try to qualify his disease progression more with a repeat CT and full PFTs.  I will also have him undergo a barium swallow to better assess his esophageal function.  We will consider setting him up for an esophageal impedence study as well.  Perhaps we could slow the progression of his disease with a Nissen if we can prove that he is still aspirating despite his omeprazole therapy.  If we see no evidence of esophageal disease to treat then we could consider a trial of prednisone and Imuran.  I asked him for his goals of care today and he said that he would like to be around a few more years to keep up with his family on his upcoming vacations.  So at this point he remains interested in aggressive care.

## 2011-09-15 NOTE — Progress Notes (Signed)
Subjective:    Patient ID: Brett [name], male    DOB: 12/20/1933, 78 y.o.   MRN: [id number]  HPI 77 y/o male with diffuse parenchymal lung disease believed to be due recurrent aspiration comes to our clinic to establish pulmonary care.  He has previously been seen by Dr. Sherene [name] in [place] over one year ago.    He was first seen by Dr. Sherene [name] in [place] for this in 2008.  He has had abnormal chest imaging and mild symptoms since 2003.  Dr. Sherene [name] felt that his syndrome likely represented chronic aspiration and put him on a PPI.  He was put on oxygen in 2011 and has had progressive dyspnea on exertion and functional decline since.  Between 2008 and 2011 he was walked on a treadmill for 3-4 miles a day fairly regularly and intentionally lost 60lbs.  He states that since a trip to [name] in 2011 when he was put on oxygen while short of breath at high altitude that he has not had the stamina to walk any longer than 3-4 minutes on a treadmill.  He has attempted in the last year, but it sounds as if he has been unable due to progressive dyspnea.  He notes continued cough which is dry and continuous throughout most of the day.  This has not worsened since he last saw Dr. Sherene [name] and he says that the tramadol helps with this. Past Medical History  Diagnosis Date  . Allergy   . Hx of colonic polyps   . Diverticulitis   . Gout   . Hypertension   . Arthritis   . Hyperlipidemia   . GERD (gastroesophageal reflux disease)   . Pulmonary fibrosis   . Aortic stenosis   . Carotid stenosis   . Cataract      Family History  Problem Relation Age of Onset  . Diabetes Mother   . Cancer Sister     breast  . Diabetes Maternal Grandmother      History   Social History  . Marital Status: Married    Spouse Name: N/A    Number of Children: 1  . Years of Education: N/A   Occupational History  . retired Proofreader    Social History Main Topics  . Smoking status: Former Smoker -- 1.0  packs/day for 5 years    Types: Cigarettes    Quit date: 10/19/1963  . Smokeless tobacco: Never Used   Comment: quit in the 60's  . Alcohol Use: Yes     rarely  . Drug Use: No  . Sexually Active: Not on file   Other Topics Concern  . Not on file   Social History Narrative   Retired Engineer, site.  No mold in home, no cedar in or around home.  No exposure to chemicals, dusts, asbestos, pesticides.  Smoked very briefly (<4 years) in teenage years.       Allergies  Allergen Reactions  . Penicillins   . Sulfonamide Derivatives     REACTION: itching     Outpatient Prescriptions Prior to Visit  Medication Sig Dispense Refill  . aspirin 81 MG tablet Take 81 mg by mouth daily.        . cetirizine (ZYRTEC) 10 MG tablet Take 10 mg by mouth daily.        . Cholecalciferol (VITAMIN D3) 1000 UNITS CAPS Take by mouth daily.        . colchicine 0.6 MG tablet Take 0.6 mg by mouth daily.        Marland Kitchen  etodolac (LODINE) 500 MG tablet TAKE 1 TABLET DAILY  90 tablet  2  . famotidine (PEPCID) 20 MG tablet Take 20 mg by mouth at bedtime.        Marland Kitchen omeprazole (PRILOSEC) 20 MG capsule TAKE ONE CAPSULE BY MOUTH TWICE A DAY  60 capsule  12  . pravastatin (PRAVACHOL) 40 MG tablet TAKE 1 TABLET DAILY  90 tablet  1  . traMADol (ULTRAM) 50 MG tablet TAKE 1 TABLET (50 MG TOTAL) BY MOUTH EVERY 8 (EIGHT) HOURS AS NEEDED FOR PAIN.  90 tablet  0  . vitamin B-12 (CYANOCOBALAMIN) 1000 MCG tablet Take 1,000 mcg by mouth daily.        . Ferrous Sulfate (SLOW RELEASE IRON PO) Take by mouth daily.        . Lutein 10 MG TABS Take by mouth daily.          Review of Systems  Constitutional: Negative for fever, chills, activity change, appetite change and unexpected weight change.  HENT: Negative for congestion, sore throat, sneezing, trouble swallowing, dental problem, voice change and postnasal drip.   Eyes: Negative for visual disturbance.  Respiratory: Positive for cough and shortness of breath. Negative for choking.    Cardiovascular: Negative for chest pain and leg swelling.  Gastrointestinal: Negative for nausea, vomiting and abdominal pain.  Genitourinary: Negative for difficulty urinating.  Musculoskeletal: Negative for arthralgias.  Skin: Negative for rash.  Psychiatric/Behavioral: Negative for behavioral problems and confusion.       Objective:   Physical Exam Filed Vitals:   09/15/11 1336  BP: 132/80  Pulse: 80  Temp: 98.2 F (36.8 C)  TempSrc: Oral  Height: 6' (1.829 m)  Weight: 218 lb 12.8 oz (99.247 kg)  SpO2: 90%   He walked 200 ft in clinic before desaturating to 88% on 4LNC.  He requested to stop due to dyspnea.  Gen: chronically ill appearing, no acute distress HEENT: NCAT, PERRL, EOMi, OP clear, neck supple without masses PULM: Insp crackles throughout, greater in R base than L CV: RRR, slight systolic murmur, no JVD AB: BS+, soft, nontender, no hsm Ext: warm, no edema, slight clubbing, no cyanosis Derm: no rash or skin breakdown Neuro: A&Ox4, CN II-XII intact, strength 5/5 in all 4 extremeties  Review of CT Thorax from 2008 reveals diffuse ground glass worse in the bases, interlobular thickening throughout, more prominent in the R lung and bases, bronchiectasis in the bases bilaterally     Assessment & Plan:   PULMONARY FIBROSIS 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  DDx of his DPLD is likely recurrent chronic aspiration vs. non-specific interstitial pneumonitis (NSIP) vs. less likely UIP.  Review of his 2008 imaging is suggestive of chronic aspiration as he has bronchiectasis in the bases.  Further, he has little to suggest a systemic inflammatory process, though this is not always necessary in NSIP.    He has had a slow progressive decline in the last 10 years, and is now hypoxemic.  He tells me that his cardiologist has been pleased with imaging of his bovine aortic valve and [name] gives little  history to suggest pulmonary edema/heart failure as a cause of his dyspnea.  Therefore I believe that his DPLD is progressing.  I explained to [name] that the only way to make a definitive diagnosis would be to perform an open lung biopsy.  The utility of this is debatable however considering Mr. Wooding's likely diagnosis  of aspiration pneumonia, and this would be a high risk procedure considering his low performance status. I believe the best approach at this point is to try to qualify his disease progression more with a repeat CT and full PFTs.  I will also have him undergo a barium swallow to better assess his esophageal function.  We will consider setting him up for an esophageal impedence study as well.  Perhaps we could slow the progression of his disease with a Nissen if we can prove that he is still aspirating despite his omeprazole therapy.  If we see no evidence of esophageal disease to treat then we could consider a trial of prednisone and Imuran.  I asked him for his goals of care today and he said that he would like to be around a few more years to keep up with his family on his upcoming vacations.  So at this point he remains interested in aggressive care.  Cough His dry cough is associated with his progressive DPLD and reflux.  I heard nothing on today's history to suggest a change in his current therapy.     Updated Medication List Outpatient Encounter Prescriptions as of 09/15/2011  Medication Sig Dispense Refill  . aspirin 81 MG tablet Take 81 mg by mouth daily.        . cetirizine (ZYRTEC) 10 MG tablet Take 10 mg by mouth daily.        . Cholecalciferol (VITAMIN D3) 1000 UNITS CAPS Take by mouth daily.        . colchicine 0.6 MG tablet Take 0.6 mg by mouth daily.        Marland Kitchen etodolac (LODINE) 500 MG tablet TAKE 1 TABLET DAILY  90 tablet  2  . famotidine (PEPCID) 20 MG tablet Take 20 mg by mouth at bedtime.        . Multiple Vitamin (MULTIVITAMIN) capsule Take 1 capsule  by mouth daily.        . Multiple Vitamins-Minerals (OCUVITE PRESERVISION) TABS Take 1 tablet by mouth daily.        Marland Kitchen omeprazole (PRILOSEC) 20 MG capsule TAKE ONE CAPSULE BY MOUTH TWICE A DAY  60 capsule  12  . pravastatin (PRAVACHOL) 40 MG tablet TAKE 1 TABLET DAILY  90 tablet  1  . traMADol (ULTRAM) 50 MG tablet TAKE 1 TABLET (50 MG TOTAL) BY MOUTH EVERY 8 (EIGHT) HOURS AS NEEDED FOR PAIN.  90 tablet  0  . vitamin B-12 (CYANOCOBALAMIN) 1000 MCG tablet Take 1,000 mcg by mouth daily.        Marland Kitchen DISCONTD: Ferrous Sulfate (SLOW RELEASE IRON PO) Take by mouth daily.        Marland Kitchen DISCONTD: Lutein 10 MG TABS Take by mouth daily.

## 2011-09-17 ENCOUNTER — Ambulatory Visit (HOSPITAL_COMMUNITY)
Admission: RE | Admit: 2011-09-17 | Discharge: 2011-09-17 | Disposition: A | Discharge status: Home / Self Care | Payer: Medicare Other | Source: Ambulatory Visit | Attending: Pulmonary Disease | Admitting: Pulmonary Disease

## 2011-09-17 DIAGNOSIS — I2510 Atherosclerotic heart disease of native coronary artery without angina pectoris: Secondary | ICD-10-CM | POA: Insufficient documentation

## 2011-09-17 DIAGNOSIS — J8410 Pulmonary fibrosis, unspecified: Secondary | ICD-10-CM

## 2011-09-17 DIAGNOSIS — I7789 Other specified disorders of arteries and arterioles: Secondary | ICD-10-CM | POA: Insufficient documentation

## 2011-09-17 DIAGNOSIS — Z954 Presence of other heart-valve replacement: Secondary | ICD-10-CM | POA: Insufficient documentation

## 2011-09-28 ENCOUNTER — Ambulatory Visit: Payer: Self-pay | Admitting: Pulmonary Disease

## 2011-09-29 ENCOUNTER — Encounter: Payer: Self-pay | Admitting: Pulmonary Disease

## 2011-09-29 ENCOUNTER — Other Ambulatory Visit: Payer: Self-pay | Admitting: Internal Medicine

## 2011-09-29 ENCOUNTER — Ambulatory Visit (INDEPENDENT_AMBULATORY_CARE_PROVIDER_SITE_OTHER): Payer: Medicare Other | Admitting: Pulmonary Disease

## 2011-09-29 DIAGNOSIS — T17908A Unspecified foreign body in respiratory tract, part unspecified causing other injury, initial encounter: Secondary | ICD-10-CM

## 2011-09-29 DIAGNOSIS — K5732 Diverticulitis of large intestine without perforation or abscess without bleeding: Secondary | ICD-10-CM

## 2011-09-29 DIAGNOSIS — J8410 Pulmonary fibrosis, unspecified: Secondary | ICD-10-CM

## 2011-09-29 DIAGNOSIS — K5792 Diverticulitis of intestine, part unspecified, without perforation or abscess without bleeding: Secondary | ICD-10-CM

## 2011-09-29 DIAGNOSIS — K224 Dyskinesia of esophagus: Secondary | ICD-10-CM

## 2011-09-29 MED ORDER — METRONIDAZOLE 250 MG PO TABS: 250.0000 mg | ORAL_TABLET | Freq: Three times a day (TID) | ORAL | Status: AC

## 2011-09-29 MED ORDER — CIPROFLOXACIN HCL 500 MG PO TABS: 500.0000 mg | ORAL_TABLET | Freq: Two times a day (BID) | ORAL | Status: AC

## 2011-09-29 NOTE — Telephone Encounter (Signed)
Patient called and he is having an attack of Diverticulitis and he sees Dr. Hazle Quant at 2:00 and he was wondering if you could call something in for this.  Please advise.

## 2011-09-29 NOTE — Progress Notes (Signed)
Subjective:    Patient ID: Brett [name], male    DOB: 09/10/1934, 77 y.o.   MRN: [id number]  HPI 09/29/11 2 week return visit: He returns today for follow up on his diffuse parenchymal lung disease.  He has undergone a barium swallow and a repeat CT scan, see below.  He has had no change in his cough and shortness of breath.  He is not exercising.  He also notes left lower quadrant cramping abdominal pain for one day.  No fever, chills, nausea, vomiting, diarrhea.  He says that this feels like his prior episodes of diverticulitis.   Review of Systems  Constitutional: Negative for fever, chills and fatigue.  Gastrointestinal: Positive for abdominal pain. Negative for nausea, vomiting, diarrhea, constipation and blood in stool.       Objective:   Physical Exam Filed Vitals:   09/29/11 1423  BP: 118/82  Pulse: 77  Temp: 97.7 F (36.5 C)  TempSrc: Oral  Height: 6' (1.829 m)  Weight: 217 lb 12.8 oz (98.793 kg)  SpO2: 94%    Gen: chronically ill appearing, no acute distress HEENT: NCAT, PERRL, EOMi, OP clear, neck supple without masses PULM: Insp crackles throughout, worse in bases CV: RRR, no mgr, no JVD AB: BS+, soft, mildly tender in the left lower quadrant, no guarding or rebound tenderness, no masses Ext: warm, trace edema, no clubbing, no cyanosis Derm: no rash or skin breakdown Neuro: A&Ox4, CN II-XII intact, strength 5/5 in all 4 extremities     09/28/11 Barium swallow: 1) minimal esophageal dysmotility, 2) Spasm vs. Mild stricture in distal esophagus  08/2011 CT chest: 1. Progressive interstitial pulmonary fibrosis (likely UIP) with  peripheral honeycombing and traction bronchiectasis.  2. Stable fusiform enlargement of the ascending thoracic aorta.  3. Stable surgical changes from aortic valve replacement surgery.  Assessment & Plan:   PULMONARY FIBROSIS I think that based on the result of the esophageal barium swallow (showing dysmotility and a stricture)  that his diffuse parenchymal lung disease is due to chronic aspiration.  Radiology was concerned that he may have UIP, but I question this diagnosis given the significant bronchiectasis in the bases and I am not confident that he has true honeycombing on CT.  Based on this, I will refer him to GI medicine for evaluation of esophageal dysmotility and this possible esophageal stricture.  He may need an endoscopy for further evaluation.  I have explained to him that in the mean time I do not see a good reason to start him empirically on prednisone or another immunosuppressant.  If GI medicine does not feel strongly that he has aspiration then I will likely refer him to [facility] for a second opinion on his lung disease.  At that point I will repeat serologies looking for evidence of systemic inflammation.  I have advised pulmonary rehab and weight loss.  Diverticulitis Today he notes left lower quadrant pain which is typical for flares of diverticulitis.   We will start him on cipro and flagyl and have him see Dr. Alphonsus Sias in follow up.  He was instructed to call Dr. Alphonsus Sias if he develops fever, chills, abdominal pain, nausea, vomiting, or blood in stool.    Updated Medication List Outpatient Encounter Prescriptions as of 09/29/2011  Medication Sig Dispense Refill  . aspirin 81 MG tablet Take 81 mg by mouth daily.        . cetirizine (ZYRTEC) 10 MG tablet Take 10 mg by mouth daily.        Marland Kitchen  Cholecalciferol (VITAMIN D3) 1000 UNITS CAPS Take by mouth daily.        . colchicine 0.6 MG tablet Take 0.6 mg by mouth daily.        Marland Kitchen etodolac (LODINE) 500 MG tablet TAKE 1 TABLET DAILY  90 tablet  2  . famotidine (PEPCID) 20 MG tablet Take 20 mg by mouth at bedtime.        . Multiple Vitamin (MULTIVITAMIN) capsule Take 1 capsule by mouth daily.        . Multiple Vitamins-Minerals (OCUVITE PRESERVISION) TABS Take 1 tablet by mouth daily.        Marland Kitchen omeprazole (PRILOSEC) 20 MG capsule TAKE ONE CAPSULE BY MOUTH TWICE  A DAY  60 capsule  12  . pravastatin (PRAVACHOL) 40 MG tablet TAKE 1 TABLET DAILY  90 tablet  1  . traMADol (ULTRAM) 50 MG tablet TAKE 1 TABLET (50 MG TOTAL) BY MOUTH EVERY 8 (EIGHT) HOURS AS NEEDED FOR PAIN.  90 tablet  0  . vitamin B-12 (CYANOCOBALAMIN) 1000 MCG tablet Take 1,000 mcg by mouth daily.        . ciprofloxacin (CIPRO) 500 MG tablet Take 1 tablet (500 mg total) by mouth 2 (two) times daily.  20 tablet  0  . metroNIDAZOLE (FLAGYL) 250 MG tablet Take 1 tablet (250 mg total) by mouth 3 (three) times daily.  30 tablet  0

## 2011-09-29 NOTE — Assessment & Plan Note (Signed)
Today he notes left lower quadrant pain which is typical for flares of diverticulitis.   We will start him on cipro and flagyl and have him see Dr. Alphonsus Sias in follow up.  He was instructed to call Dr. Alphonsus Sias if he develops fever, chills, abdominal pain, nausea, vomiting, or blood in stool.

## 2011-09-29 NOTE — Patient Instructions (Signed)
We will refer you to pulmonary rehab at [facility]. We will refer you to a gastroenterologist in [place]. We will prescribe cipro and flagyl for your diverticulitis.  Take as directed and call Dr. Alphonsus Sias if you develop fever, chills, abdominal pain, blood in stool, or nausea and vomiting. We will see you back in 4-6 weeks after you have seen the gastroenterologist.

## 2011-09-29 NOTE — Telephone Encounter (Signed)
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

## 2011-09-29 NOTE — Assessment & Plan Note (Addendum)
I think that based on the result of the esophageal barium swallow (showing dysmotility and a stricture) that his diffuse parenchymal lung disease is due to chronic aspiration.  Radiology was concerned that he may have UIP, but I question this diagnosis given the significant bronchiectasis in the bases and I am not confident that he has true honeycombing on CT.  Based on this, I will refer him to GI medicine for evaluation of esophageal dysmotility and this possible esophageal stricture.  He may need an endoscopy for further evaluation.  I have explained to him that in the mean time I do not see a good reason to start him empirically on prednisone or another immunosuppressant.  If GI medicine does not feel strongly that he has aspiration then I will likely refer him to [facility] for a second opinion on his lung disease.  At that point I will repeat serologies looking for evidence of systemic inflammation.  I have advised pulmonary rehab and weight loss.

## 2011-09-29 NOTE — Telephone Encounter (Signed)
Spoke with patient and advised results   

## 2011-10-02 ENCOUNTER — Other Ambulatory Visit: Payer: Self-pay | Admitting: Internal Medicine

## 2011-10-04 NOTE — Telephone Encounter (Signed)
Refill done electronically

## 2011-10-04 NOTE — Telephone Encounter (Signed)
Ok to fill? Last refill 09/04/11

## 2011-10-11 ENCOUNTER — Encounter: Payer: Self-pay | Admitting: Pulmonary Disease

## 2011-10-25 ENCOUNTER — Telehealth: Payer: Self-pay | Admitting: Pulmonary Disease

## 2011-10-25 NOTE — Telephone Encounter (Signed)
Lm for pt tcb x1

## 2011-10-25 NOTE — Telephone Encounter (Signed)
Spoke with pt and notified that we do not need to see him back until 4-6 wks after he sees GI, unless of course is having an acute pulm issue. Pt verbalized understanding and states doing well currently. He is scheduled to see GI end of this month and will call after that appt to schedule rov with Dr. Kendrick Fries.

## 2011-10-26 ENCOUNTER — Encounter: Payer: Self-pay | Admitting: Internal Medicine

## 2011-11-09 ENCOUNTER — Telehealth: Payer: Self-pay | Admitting: Pulmonary Disease

## 2011-11-09 NOTE — Telephone Encounter (Signed)
lmtcb x1 for Brett [name]

## 2011-11-09 NOTE — Telephone Encounter (Signed)
I have the fax asking for pulmonary clearance for his endoscopy. Will give to Dr. Kendrick Fries on 11/22/11 when we will be at [facility] again. Spoke with Cortney and notified of this. She verbalized understanding. States that they just need to have this prior to the procedure on 11/24/11. Will forward the msg to Dr. Kendrick Fries to give him a heads up.

## 2011-11-09 NOTE — Telephone Encounter (Signed)
I spoke with courtney and she is going to refax the form over to our triage fax #. Will await fax

## 2011-11-12 ENCOUNTER — Ambulatory Visit (INDEPENDENT_AMBULATORY_CARE_PROVIDER_SITE_OTHER): Payer: Medicare Other | Admitting: Internal Medicine

## 2011-11-12 ENCOUNTER — Ambulatory Visit: Payer: Medicare Other | Admitting: Internal Medicine

## 2011-11-12 ENCOUNTER — Encounter: Payer: Self-pay | Admitting: Internal Medicine

## 2011-11-12 DIAGNOSIS — K148 Other diseases of tongue: Secondary | ICD-10-CM | POA: Insufficient documentation

## 2011-11-12 NOTE — Progress Notes (Signed)
Subjective:    Patient ID: Brett [name], male    DOB: 1934-03-31, 78 y.o.   MRN: [id number]  HPI Has rescheduled regular checkup due to weather  Came today due to spot on his tongue  Started last week as burn from hot food Then bit it a couple of days ago and bled poorly  Doesn't hurt He can feel it--not clearly hard  Hasn't tried any Rx  Current Outpatient Prescriptions on File Prior to Visit  Medication Sig Dispense Refill  . aspirin 81 MG tablet Take 81 mg by mouth daily.        . cetirizine (ZYRTEC) 10 MG tablet Take 10 mg by mouth daily.        . Cholecalciferol (VITAMIN D3) 1000 UNITS CAPS Take by mouth daily.        . colchicine 0.6 MG tablet Take 0.6 mg by mouth daily.        Marland Kitchen etodolac (LODINE) 500 MG tablet TAKE 1 TABLET DAILY  90 tablet  2  . famotidine (PEPCID) 20 MG tablet Take 20 mg by mouth at bedtime.        . Multiple Vitamin (MULTIVITAMIN) capsule Take 1 capsule by mouth daily.        . Multiple Vitamins-Minerals (OCUVITE PRESERVISION) TABS Take 1 tablet by mouth daily.        Marland Kitchen omeprazole (PRILOSEC) 20 MG capsule TAKE ONE CAPSULE BY MOUTH TWICE A DAY  60 capsule  12  . pravastatin (PRAVACHOL) 40 MG tablet TAKE 1 TABLET DAILY  90 tablet  1  . traMADol (ULTRAM) 50 MG tablet TAKE 1 TABLET (50 MG TOTAL) BY MOUTH EVERY 8 (EIGHT) HOURS AS NEEDED FOR PAIN.  90 tablet  0  . vitamin B-12 (CYANOCOBALAMIN) 1000 MCG tablet Take 1,000 mcg by mouth daily.          Allergies  Allergen Reactions  . Penicillins   . Sulfonamide Derivatives     REACTION: itching    Past Medical History  Diagnosis Date  . Allergy   . Hx of colonic polyps   . Diverticulitis   . Gout   . Hypertension   . Arthritis   . Hyperlipidemia   . GERD (gastroesophageal reflux disease)   . Pulmonary fibrosis   . Aortic stenosis   . Carotid stenosis   . Cataract     Past Surgical History  Procedure Date  . Anomalous pulmonary venous return repair   . Cardiac catheterization   . Melanoma  excision     shoulder    Family History  Problem Relation Age of Onset  . Diabetes Mother   . Cancer Sister     breast  . Diabetes Maternal Grandmother     History   Social History  . Marital Status: Married    Spouse Name: N/A    Number of Children: 1  . Years of Education: N/A   Occupational History  . retired Proofreader    Social History Main Topics  . Smoking status: Former Smoker -- 1.0 packs/day for 5 years    Types: Cigarettes    Quit date: 10/19/1963  . Smokeless tobacco: Never Used   Comment: quit in the 60's  . Alcohol Use: Yes     rarely  . Drug Use: No  . Sexually Active: Not on file   Other Topics Concern  . Not on file   Social History Narrative   Retired Engineer, site.  No mold in home, no cedar in  or around home.  No exposure to chemicals, dusts, asbestos, pesticides.  Smoked very briefly (<4 years) in teenage years.     Review of Systems Stress with granddaughter being depressed, pulled out of school Seeking mental health hospitalization    Objective:   Physical Exam  HENT:       Small flesh colored raised lesion on top of tongue ~3cm in and to right of midline Firm in texture but doesn't seem to have any underlying induration          Assessment & Plan:

## 2011-11-12 NOTE — Assessment & Plan Note (Signed)
Looks like a hypertrophic area--probably from the trauma Doesn't look neoplastic Would just observe---will recheck at his check up next week

## 2011-11-13 ENCOUNTER — Other Ambulatory Visit: Payer: Self-pay | Admitting: Internal Medicine

## 2011-11-19 ENCOUNTER — Ambulatory Visit (INDEPENDENT_AMBULATORY_CARE_PROVIDER_SITE_OTHER): Payer: Medicare Other | Admitting: Internal Medicine

## 2011-11-19 ENCOUNTER — Encounter: Payer: Self-pay | Admitting: Internal Medicine

## 2011-11-19 DIAGNOSIS — E785 Hyperlipidemia, unspecified: Secondary | ICD-10-CM

## 2011-11-19 DIAGNOSIS — I10 Essential (primary) hypertension: Secondary | ICD-10-CM

## 2011-11-19 DIAGNOSIS — G589 Mononeuropathy, unspecified: Secondary | ICD-10-CM

## 2011-11-19 DIAGNOSIS — J8410 Pulmonary fibrosis, unspecified: Secondary | ICD-10-CM

## 2011-11-19 NOTE — Assessment & Plan Note (Signed)
Stable sensory loss on plantar feet Pain not an issue

## 2011-11-19 NOTE — Progress Notes (Signed)
Subjective:    Patient ID: Brett [name], male    DOB: 11-10-1933, 78 y.o.   MRN: [id number]  HPI Has GI eval next week ??chronic aspiration as cause or worsening of his chronic pulmonary disease Has had sig success with pulmonary rehab--he notes better functional state (but still has trouble on treadmill unless they give boosts of oxygen)  Tongue is better Still swollen but softer and improved  Feet numbness persists  No sig pain No imbalance problems---usually walks with oxygen machine which may balance  No chest pain except slight subxiphoid tightness if oxygen low and panting No palpitations--though skips noted at pulm rehab  Current Outpatient Prescriptions on File Prior to Visit  Medication Sig Dispense Refill  . aspirin 81 MG tablet Take 81 mg by mouth daily.        . cetirizine (ZYRTEC) 10 MG tablet Take 10 mg by mouth daily.        . Cholecalciferol (VITAMIN D3) 1000 UNITS CAPS Take by mouth daily.        . colchicine 0.6 MG tablet Take 0.6 mg by mouth daily.        Marland Kitchen etodolac (LODINE) 500 MG tablet TAKE 1 TABLET DAILY  90 tablet  2  . famotidine (PEPCID) 20 MG tablet Take 20 mg by mouth at bedtime.        . Multiple Vitamin (MULTIVITAMIN) capsule Take 1 capsule by mouth daily.        . Multiple Vitamins-Minerals (OCUVITE PRESERVISION) TABS Take 1 tablet by mouth daily.        Marland Kitchen omeprazole (PRILOSEC) 20 MG capsule TAKE ONE CAPSULE BY MOUTH TWICE A DAY  60 capsule  12  . pravastatin (PRAVACHOL) 40 MG tablet TAKE 1 TABLET DAILY  90 tablet  1  . traMADol (ULTRAM) 50 MG tablet TAKE 1 TABLET (50 MG TOTAL) BY MOUTH EVERY 8 (EIGHT) HOURS AS NEEDED FOR PAIN.  90 tablet  0  . vitamin B-12 (CYANOCOBALAMIN) 1000 MCG tablet Take 1,000 mcg by mouth daily.          Allergies  Allergen Reactions  . Penicillins   . Sulfonamide Derivatives     REACTION: itching    Past Medical History  Diagnosis Date  . Allergy   . Hx of colonic polyps   . Diverticulitis   . Gout   .  Hypertension   . Arthritis   . Hyperlipidemia   . GERD (gastroesophageal reflux disease)   . Pulmonary fibrosis   . Aortic stenosis   . Carotid stenosis   . Cataract     Past Surgical History  Procedure Date  . Anomalous pulmonary venous return repair   . Cardiac catheterization   . Melanoma excision     shoulder    Family History  Problem Relation Age of Onset  . Diabetes Mother   . Cancer Sister     breast  . Diabetes Maternal Grandmother     History   Social History  . Marital Status: Married    Spouse Name: N/A    Number of Children: 1  . Years of Education: N/A   Occupational History  . retired Proofreader    Social History Main Topics  . Smoking status: Former Smoker -- 1.0 packs/day for 5 years    Types: Cigarettes    Quit date: 10/19/1963  . Smokeless tobacco: Never Used   Comment: quit in the 60's  . Alcohol Use: Yes     rarely  . Drug  Use: No  . Sexually Active: Not on file   Other Topics Concern  . Not on file   Social History Narrative   Retired Engineer, site.  No mold in home, no cedar in or around home.  No exposure to chemicals, dusts, asbestos, pesticides.  Smoked very briefly (<4 years) in teenage years.     Review of Systems Appetite is down from past Slow weight loss over some time---by design Has lost 17# in past year Sleeps well     Objective:   Physical Exam  Constitutional: He appears well-developed and well-nourished. No distress.  Neck: Normal range of motion. Neck supple. No thyromegaly present.  Cardiovascular: Normal rate, normal heart sounds and intact distal pulses.  Exam reveals no gallop.   No murmur heard.      occ skips  Pulmonary/Chest: Effort normal. No respiratory distress. He has no wheezes.       Fine bibasilar crackles  Musculoskeletal: He exhibits no edema and no tenderness.  Lymphadenopathy:    He has no cervical adenopathy.  Psychiatric: He has a normal mood and affect. His behavior is  normal. Thought content normal.          Assessment & Plan:

## 2011-11-19 NOTE — Assessment & Plan Note (Signed)
Being evaluated for possible chronic aspiration

## 2011-11-19 NOTE — Assessment & Plan Note (Signed)
BP Readings from Last 3 Encounters:  11/19/11 112/60  11/12/11 150/80  09/29/11 118/82   Good control No changes needed

## 2011-11-19 NOTE — Assessment & Plan Note (Signed)
Lab Results  Component Value Date   LDLCALC 84 07/13/2011   At goal

## 2011-11-22 NOTE — Telephone Encounter (Signed)
Signed and faxed today.

## 2011-11-24 ENCOUNTER — Ambulatory Visit: Payer: Self-pay | Admitting: Gastroenterology

## 2011-11-25 ENCOUNTER — Other Ambulatory Visit: Payer: Self-pay | Admitting: Internal Medicine

## 2011-11-26 ENCOUNTER — Ambulatory Visit (INDEPENDENT_AMBULATORY_CARE_PROVIDER_SITE_OTHER): Payer: Medicare Other | Admitting: Pulmonary Disease

## 2011-11-26 ENCOUNTER — Encounter: Payer: Self-pay | Admitting: Pulmonary Disease

## 2011-11-26 DIAGNOSIS — J8410 Pulmonary fibrosis, unspecified: Secondary | ICD-10-CM

## 2011-11-26 DIAGNOSIS — J9610 Chronic respiratory failure, unspecified whether with hypoxia or hypercapnia: Secondary | ICD-10-CM

## 2011-11-26 NOTE — Patient Instructions (Signed)
Keep going to pulmonary rehab.  We will set up full PFT's at [facility] the last week of march and will then see you in clinic in April.  Call us sooner if needed

## 2011-11-26 NOTE — Progress Notes (Signed)
Subjective:    Patient ID: Brett [name], male    DOB: 07/28/1934, 77 y.o.   MRN: [id number]  HPI 09/29/11 2 week return visit:  He returns today for follow up on his diffuse parenchymal lung disease. He has undergone a barium swallow and a repeat CT scan, see below. He has had no change in his cough and shortness of breath. He is not exercising.  He also notes left lower quadrant cramping abdominal pain for one day. No fever, chills, nausea, vomiting, diarrhea. He says that this feels like his prior episodes of diverticulitis.  11/26/11 ROV -- Brett [name] returns for a follow up on his pulmonary fibrosis believed to be due to aspiration.  He underwent a balloon dilation of his esophagus this week after the esophogram showed a distal stricture.  I don't have the report from this yet. He is participating in pulmonary rehab and feels like it is going well.  He states that his breathing is about the same as when I saw him before.  He has lost a few pounds intentionally. His cough is at baseline.  He is planning a trip to [place] soon.  Review of Systems Gen: he has lost a few pounds intentionally, energy improved Pulm: cough is at baseline, see HPI GI: no dysphagia, nausea, vomiting    Objective:   Physical Exam  Filed Vitals:   11/26/11 1542  BP: 126/66  Pulse: 89  Temp: 97.4 F (36.3 C)  TempSrc: Oral  Height: 6' (1.829 m)  Weight: 215 lb (97.523 kg)  SpO2: 91%    Gen: chronically ill, overweight white male HEENT: NCAT, PERRL, EOMi, OP clear, neck supple without masses PULM: Insp crackles, wheezes noted throughout CV: RRR, no mgr, no JVD Ext: warm, trace edema, notable for clubbing, no cyanosis Neuro: A&Ox4, MAEW      Assessment & Plan:   PULMONARY FIBROSIS This has been a fairly stable interval for Brett [name].  I still believe that his lung disease is due to chronic aspiration.  Less likely etiologies include UIP vs. chronic HP (air trapping on 2011 study).  He has  progressed since his original presentation to our office in 2008, but hopefully after the balloon dilation we will see the progression slow somewhat.  We will repeat full PFT's at [facility] around the end of March and then I will see him in April.  If he shows any sort of decline despite pulm rehab (oxygenation, exercise tolerance) in that interval then we will consider systemic immunosuppression and possibly a referal to [facility] for a second opinion.    In the meantime he is to continue pulmonary rehab.  RESPIRATORY FAILURE, CHRONIC Continue to use 4 L oxygen at rest and 6 with exercise.    Updated Medication List Outpatient Encounter Prescriptions as of 11/26/2011  Medication Sig Dispense Refill  . aspirin 81 MG tablet Take 81 mg by mouth daily.        . cetirizine (ZYRTEC) 10 MG tablet Take 10 mg by mouth daily.        . Cholecalciferol (VITAMIN D3) 1000 UNITS CAPS Take by mouth daily.        . colchicine 0.6 MG tablet Take 0.6 mg by mouth daily.        . etodolac (LODINE) 500 MG tablet TAKE 1 TABLET DAILY  90 tablet  1  . famotidine (PEPCID) 20 MG tablet Take 20 mg by mouth at bedtime.        . Multiple Vitamin (MULTIVITAMIN)   capsule Take 1 capsule by mouth daily.        . Multiple Vitamins-Minerals (OCUVITE PRESERVISION) TABS Take 1 tablet by mouth daily.        . omeprazole (PRILOSEC) 20 MG capsule TAKE ONE CAPSULE BY MOUTH TWICE A DAY  60 capsule  12  . pravastatin (PRAVACHOL) 40 MG tablet TAKE 1 TABLET DAILY  90 tablet  1  . traMADol (ULTRAM) 50 MG tablet       . vitamin B-12 (CYANOCOBALAMIN) 1000 MCG tablet Take 1,000 mcg by mouth daily.        . DISCONTD: traMADol (ULTRAM) 50 MG tablet TAKE 1 TABLET (50 MG TOTAL) BY MOUTH EVERY 8 (EIGHT) HOURS AS NEEDED FOR PAIN.  90 tablet  0      

## 2011-11-26 NOTE — Assessment & Plan Note (Signed)
Continue to use 4 L oxygen at rest and 6 with exercise.

## 2011-11-26 NOTE — Assessment & Plan Note (Signed)
This has been a fairly stable interval for Brett [name].  I still believe that his lung disease is due to chronic aspiration.  Less likely etiologies include UIP vs. chronic HP (air trapping on 2011 study).  He has progressed since his original presentation to our office in 2008, but hopefully after the balloon dilation we will see the progression slow somewhat.  We will repeat full PFT's at [facility] around the end of March and then I will see him in April.  If he shows any sort of decline despite pulm rehab (oxygenation, exercise tolerance) in that interval then we will consider systemic immunosuppression and possibly a referal to [facility] for a second opinion.    In the meantime he is to continue pulmonary rehab.

## 2011-11-30 ENCOUNTER — Ambulatory Visit (INDEPENDENT_AMBULATORY_CARE_PROVIDER_SITE_OTHER): Payer: Medicare Other | Admitting: Internal Medicine

## 2011-11-30 ENCOUNTER — Telehealth: Payer: Self-pay | Admitting: Pulmonary Disease

## 2011-11-30 ENCOUNTER — Other Ambulatory Visit (INDEPENDENT_AMBULATORY_CARE_PROVIDER_SITE_OTHER): Payer: Medicare Other

## 2011-11-30 ENCOUNTER — Encounter: Payer: Self-pay | Admitting: Internal Medicine

## 2011-11-30 ENCOUNTER — Ambulatory Visit (INDEPENDENT_AMBULATORY_CARE_PROVIDER_SITE_OTHER)
Admission: RE | Admit: 2011-11-30 | Discharge: 2011-11-30 | Disposition: A | Discharge status: Home / Self Care | Payer: Medicare Other | Source: Ambulatory Visit | Attending: Internal Medicine | Admitting: Internal Medicine

## 2011-11-30 VITALS — BP 102/60 | HR 58 | Temp 97.8°F

## 2011-11-30 DIAGNOSIS — R0609 Other forms of dyspnea: Secondary | ICD-10-CM

## 2011-11-30 DIAGNOSIS — J8410 Pulmonary fibrosis, unspecified: Secondary | ICD-10-CM

## 2011-11-30 DIAGNOSIS — R0600 Dyspnea, unspecified: Secondary | ICD-10-CM

## 2011-11-30 DIAGNOSIS — J9610 Chronic respiratory failure, unspecified whether with hypoxia or hypercapnia: Secondary | ICD-10-CM

## 2011-11-30 DIAGNOSIS — R0989 Other specified symptoms and signs involving the circulatory and respiratory systems: Secondary | ICD-10-CM

## 2011-11-30 LAB — CBC WITH DIFFERENTIAL/PLATELET
Basophils Relative: 0.3 % (ref 0.0–3.0)
Eosinophils Relative: 1.3 % (ref 0.0–5.0)
Hemoglobin: 11.7 g/dL — ABNORMAL LOW (ref 13.0–17.0)
Lymphocytes Relative: 20.2 % (ref 12.0–46.0)
MCV: 86.3 fl (ref 78.0–100.0)
Monocytes Absolute: 0.3 10*3/uL (ref 0.1–1.0)
Neutrophils Relative %: 72.7 % (ref 43.0–77.0)
RBC: 4.16 Mil/uL — ABNORMAL LOW (ref 4.22–5.81)
WBC: 5.9 10*3/uL (ref 4.5–10.5)

## 2011-11-30 LAB — BASIC METABOLIC PANEL
BUN: 22 mg/dL (ref 6–23)
Chloride: 101 mEq/L (ref 96–112)
Glucose, Bld: 107 mg/dL — ABNORMAL HIGH (ref 70–99)
Potassium: 4.4 mEq/L (ref 3.5–5.1)

## 2011-11-30 MED ORDER — PREDNISONE (PAK) 10 MG PO TABS: ORAL_TABLET | ORAL | Status: AC

## 2011-11-30 NOTE — Progress Notes (Deleted)
Subjective:    Patient ID: Brett [name], male    DOB: May 27, 1934, 78 y.o.   MRN: [id number]  HPI 09/29/11 2 week return visit:  He returns today for follow up on his diffuse parenchymal lung disease. He has undergone a barium swallow and a repeat CT scan, see below. He has had no change in his cough and shortness of breath. He is not exercising.  He also notes left lower quadrant cramping abdominal pain for one day. No fever, chills, nausea, vomiting, diarrhea. He says that this feels like his prior episodes of diverticulitis.  11/26/11 ROV -- Brett [name] returns for a follow up on his pulmonary fibrosis believed to be due to aspiration.  He underwent a balloon dilation of his esophagus this week after the esophogram showed a distal stricture.  I don't have the report from this yet. He is participating in pulmonary rehab and feels like it is going well.  He states that his breathing is about the same as when I saw him before.  He has lost a few pounds intentionally. His cough is at baseline.  He is planning a trip to [place] soon.  Review of Systems Gen: he has lost a few pounds intentionally, energy improved Pulm: cough is at baseline, see HPI GI: no dysphagia, nausea, vomiting    Objective:   Physical Exam  Filed Vitals:   11/26/11 1542  BP: 126/66  Pulse: 89  Temp: 97.4 F (36.3 C)  TempSrc: Oral  Height: 6' (1.829 m)  Weight: 215 lb (97.523 kg)  SpO2: 91%    Gen: chronically ill, overweight white male HEENT: NCAT, PERRL, EOMi, OP clear, neck supple without masses PULM: Insp crackles, wheezes noted throughout CV: RRR, no mgr, no JVD Ext: warm, trace edema, notable for clubbing, no cyanosis Neuro: A&Ox4, MAEW      Assessment & Plan:   PULMONARY FIBROSIS This has been a fairly stable interval for Brett [name].  I still believe that his lung disease is due to chronic aspiration.  Less likely etiologies include UIP vs. chronic HP (air trapping on 2011 study).  He has  progressed since his original presentation to our office in 2008, but hopefully after the balloon dilation we will see the progression slow somewhat.  We will repeat full PFT's at [facility] around the end of March and then I will see him in April.  If he shows any sort of decline despite pulm rehab (oxygenation, exercise tolerance) in that interval then we will consider systemic immunosuppression and possibly a referal to [facility] for a second opinion.    In the meantime he is to continue pulmonary rehab.  RESPIRATORY FAILURE, CHRONIC Continue to use 4 L oxygen at rest and 6 with exercise.    Updated Medication List Outpatient Encounter Prescriptions as of 11/26/2011  Medication Sig Dispense Refill  . aspirin 81 MG tablet Take 81 mg by mouth daily.        . cetirizine (ZYRTEC) 10 MG tablet Take 10 mg by mouth daily.        . Cholecalciferol (VITAMIN D3) 1000 UNITS CAPS Take by mouth daily.        . colchicine 0.6 MG tablet Take 0.6 mg by mouth daily.        Marland Kitchen etodolac (LODINE) 500 MG tablet TAKE 1 TABLET DAILY  90 tablet  1  . famotidine (PEPCID) 20 MG tablet Take 20 mg by mouth at bedtime.        . Multiple Vitamin (MULTIVITAMIN)  capsule Take 1 capsule by mouth daily.        . Multiple Vitamins-Minerals (OCUVITE PRESERVISION) TABS Take 1 tablet by mouth daily.        Marland Kitchen omeprazole (PRILOSEC) 20 MG capsule TAKE ONE CAPSULE BY MOUTH TWICE A DAY  60 capsule  12  . pravastatin (PRAVACHOL) 40 MG tablet TAKE 1 TABLET DAILY  90 tablet  1  . traMADol (ULTRAM) 50 MG tablet       . vitamin B-12 (CYANOCOBALAMIN) 1000 MCG tablet Take 1,000 mcg by mouth daily.        Marland Kitchen DISCONTD: traMADol (ULTRAM) 50 MG tablet TAKE 1 TABLET (50 MG TOTAL) BY MOUTH EVERY 8 (EIGHT) HOURS AS NEEDED FOR PAIN.  90 tablet  0

## 2011-11-30 NOTE — Telephone Encounter (Signed)
Called and spoke with pt.  Pt states he was seen today by MW for an acute sick visit for increased sob.  Pt states MW recs to continue on o2 24/7 at 4 LPM but increase to 6 LPM with activity.  Pt states [facility] came to the house today and dropped off "big tanks" and took his eclipse that he had because the eclipse doesn't go up to 6 LPM.  Pt states the big tanks are very hard to be mobile with and only last approx an hour or so.  Therefore pt is wondering if there is ?liquid o2 Or some other portable o2 system that pt can get that goes up to 6 LPM that pt can take outside the home.  (FYI: pt does still have his big concentrator at the house to use when he is home and not mobile)  Dr. Kendrick Fries, Please advise.  Thank you!!

## 2011-11-30 NOTE — Patient Instructions (Signed)
Please remember to go to the lab and x-ray department downstairs for your tests - we will call you with the results when they are available.    Wear 4lpm 24 hours per day but increase to 6lpm with activity   Prednisone 10 mg take  4 each am x 2 days,   2 each am x 2 days,  1 each am x2days and stop  Return next available opening to see Dr Kendrick Fries to regroup re your [phone]

## 2011-11-30 NOTE — Telephone Encounter (Signed)
I think this is a question for the oxygen supplier ([employer]).  Please find out if they supply such a device and I will be happy to write a script for it.

## 2011-11-30 NOTE — Progress Notes (Signed)
Subjective:     Patient ID: Brett [name], male   DOB: 06-04-1934  MRN: [id number] HPI   Brief patient profile:  77 yowm quit smoking around 1970 with a history of pulmonary fibrosis dating back symptomatically to 2003 associated with the urge to clear his throat but little else in terms of significant symptoms at original pulmoary eval in 11/2006 assoc with cough better on ppi and microcytic anemia    HPI June 02, 2010 never recovered from flu April 2011 and went driving to [place] in June not needing 02 when left but placed on it while there and drove back with it. cough more than usual but dry.  rec no oil based vitamins but still using perservision with soy oil   July 20, 2010 6 wk followup with PFT's. Pt states that his breathing is some better since last seen. He states that he still has the same cough- sometimes prod with green sputum. rec Avoid oil based vitamins if possible ask your eye doctor for a substitue  Wear 02 at all times @ 2lpm  Late add: increase 02 to 4lpm with activity more than just walking around the house   August 31, 2010 ov cc doe some better on new rx for take 4 with activity. Wear 02 at all times @ 2lpm  Increase 02 to 4lpm with activity more than just walking around the house   See Dr Kendrick Fries ov's for interim hx  11/30/2011  Acute w/in  ov/Bradin Mcadory cc  increased SOB at rest x 4 days and states generally does not feel well.  Nl dry cough "24 hours per day". In retrospect has had increase sob with activity since Christmas 2012  but when doing rehab wears mask 02 and has done  fine but skipped since  2/8. Recent echo at [facility] not avail  Sleeping ok without nocturnal  or early am exacerbation  of respiratory  c/o's or need for noct saba. Also denies any obvious fluctuation of symptoms with weather or environmental changes or other aggravating or alleviating factors except as outlined above   ROS  At present neg for  any significant sore throat, dysphagia,  itching, sneezing,  nasal congestion or excess/ purulent secretions,  fever, chills, sweats, unintended wt loss, pleuritic or exertional cp, hempoptysis, orthopnea pnd or leg swelling.  Also denies presyncope, palpitations, heartburn, abdominal pain, nausea, vomiting, diarrhea  or change in bowel or urinary habits, dysuria,hematuria,  rash, arthralgias, visual complaints, headache, numbness weakness or ataxia.    Past Medical History:  Allergic rhinitis  Colonic polyps, hx of--tubular adenoma  Diverticulosis, colon  Gout  Hypertension  Osteoarthritis--hands, hips  Pulmonary fibrosis-----------------------------------------Dr McQuaid/ [place] office - PFT's 01/01/07 VC 56% DLC0 42% not corrected for anemia  - PFT's 07/20/10 VC 48% DLC 0 41%  - 02 dep since June 2011  - Review for Perfenidone trial August 31, 2010 >>> VC below 50%, does not qualify  -Desats on pulsed 02 a@ 4lpm November 04, 2010 x one lap  Hyperlipidemia  GERD  Aortic stenosis---------------------------------------------Dr Gwen Pounds  - sp avr 2005  Carotid stenosis  Review of Systems     Objective:   Physical Exam  In general this is a chronically ill w/c bound wm nad at rest on 02 wt 234 >236 June 02, 2010 > 235 July 21, 2010 > 239 August 31, 2010 > 228 November 04, 2010  Vs declined wt 11/30/11 HEENT: nl dentition, turbinates, and orophanx. Nl external ear canals without cough reflex  Neck  without JVD/Nodes/TM  Lungs bilateral insp crackles with a few sqeaks and pops on insp as well  RRR no s3 or murmur or increase in P2, no edema  Abd soft and benign with nl excursion in the supine position. No bruits or organomegaly  Ext warm without calf tenderness, cyanosis but very subtle clubbing is present  Skin warm and dry without lesions      CXR  11/30/2011 :   No active disease. Stable bilateral fibrotic changes       Assessment:          Plan:

## 2011-12-01 NOTE — Telephone Encounter (Signed)
I have placed order and pt is aware [facility] will contact him regarding this. He voiced his understanding and needed nothing further.

## 2011-12-01 NOTE — Telephone Encounter (Signed)
Well there is liquid 02 called helios dr Kendrick Fries needs to send an order for helios eval thanks

## 2011-12-01 NOTE — Telephone Encounter (Signed)
Will forward to the [facility] so they may check on this for the patient.

## 2011-12-02 ENCOUNTER — Telehealth: Payer: Self-pay | Admitting: Pulmonary Disease

## 2011-12-02 DIAGNOSIS — J9610 Chronic respiratory failure, unspecified whether with hypoxia or hypercapnia: Secondary | ICD-10-CM

## 2011-12-02 DIAGNOSIS — R0600 Dyspnea, unspecified: Secondary | ICD-10-CM

## 2011-12-02 DIAGNOSIS — J8410 Pulmonary fibrosis, unspecified: Secondary | ICD-10-CM

## 2011-12-02 NOTE — Progress Notes (Signed)
Quick Note:  Spoke with pt and notified of results per Dr. Wert. Pt verbalized understanding and denied any questions.  ______ 

## 2011-12-02 NOTE — Assessment & Plan Note (Signed)
Adequate control on present rx, reviewed 02 4lpm at rest and 6 lpm with activity for now

## 2011-12-02 NOTE — Assessment & Plan Note (Addendum)
Much worse now assoc with BNP > 1000 with recent echo at [facility] Probably needs a trial of diuretics to see if this improves his breathing  But then will need close f/u bp (low side already) electrolytes and creat.  I had an extended discussion with the patient today lasting 15 to 20 minutes of a 25 minute visit on the following issues:   For now only option for pf is max gerd rx and adjust 02 appropriately, Dr Kendrick Fries may consider [facility] eval next

## 2011-12-02 NOTE — Telephone Encounter (Signed)
i spoke with Brett [name] and she stated they need an order to reflect change in pt's oxygen. She stated per Last OV note pt to use 4l at rest and can increase to 6l w/ activity. They never received this order. I looked at last OV note and confirmed this was so. I have placed order to be sent to [facility] and Brett [name] is aware. Nothing further was needed

## 2011-12-09 ENCOUNTER — Encounter: Payer: Self-pay | Admitting: Internal Medicine

## 2011-12-17 ENCOUNTER — Ambulatory Visit: Payer: Self-pay | Admitting: Internal Medicine

## 2011-12-17 ENCOUNTER — Other Ambulatory Visit: Payer: Self-pay | Admitting: *Deleted

## 2011-12-17 ENCOUNTER — Encounter: Payer: Self-pay | Admitting: Internal Medicine

## 2011-12-17 MED ORDER — TRAMADOL HCL 50 MG PO TABS: 50.0000 mg | ORAL_TABLET | Freq: Four times a day (QID) | ORAL | Status: DC | PRN

## 2011-12-17 NOTE — Telephone Encounter (Signed)
Okay #90 x 0 

## 2011-12-17 NOTE — Telephone Encounter (Signed)
rx sent to pharmacy by e-script  

## 2011-12-21 NOTE — Telephone Encounter (Signed)
Note entered in error

## 2011-12-26 ENCOUNTER — Other Ambulatory Visit: Payer: Self-pay | Admitting: Internal Medicine

## 2011-12-27 ENCOUNTER — Inpatient Hospital Stay: Payer: Self-pay | Admitting: Student

## 2011-12-27 LAB — COMPREHENSIVE METABOLIC PANEL
Albumin: 3.1 g/dL — ABNORMAL LOW (ref 3.4–5.0)
Alkaline Phosphatase: 45 U/L — ABNORMAL LOW (ref 50–136)
Anion Gap: 9 (ref 7–16)
BUN: 42 mg/dL — ABNORMAL HIGH (ref 7–18)
Bilirubin,Total: 0.5 mg/dL (ref 0.2–1.0)
Chloride: 98 mmol/L (ref 98–107)
Co2: 30 mmol/L (ref 21–32)
Creatinine: 1.55 mg/dL — ABNORMAL HIGH (ref 0.60–1.30)
EGFR (African American): 57 — ABNORMAL LOW
EGFR (Non-African Amer.): 47 — ABNORMAL LOW
Glucose: 128 mg/dL — ABNORMAL HIGH (ref 65–99)
Osmolality: 286 (ref 275–301)
Potassium: 4.7 mmol/L (ref 3.5–5.1)
SGPT (ALT): 648 U/L — ABNORMAL HIGH
Sodium: 137 mmol/L (ref 136–145)
Total Protein: 8.7 g/dL — ABNORMAL HIGH (ref 6.4–8.2)

## 2011-12-27 LAB — URINALYSIS, COMPLETE
Bilirubin,UR: NEGATIVE
Blood: NEGATIVE
Glucose,UR: NEGATIVE mg/dL (ref 0–75)
Hyaline Cast: 36
Ketone: NEGATIVE
Nitrite: NEGATIVE
Ph: 5 (ref 4.5–8.0)
Protein: 30
RBC,UR: 16 /HPF (ref 0–5)

## 2011-12-27 LAB — CBC WITH DIFFERENTIAL/PLATELET
Basophil #: 0 10*3/uL (ref 0.0–0.1)
Eosinophil #: 0 10*3/uL (ref 0.0–0.7)
HGB: 11 g/dL — ABNORMAL LOW (ref 13.0–18.0)
Lymphocyte #: 1 10*3/uL (ref 1.0–3.6)
MCH: 27.9 pg (ref 26.0–34.0)
MCHC: 32.8 g/dL (ref 32.0–36.0)
MCV: 85 fL (ref 80–100)
Monocyte #: 0.2 10*3/uL (ref 0.0–0.7)
Monocyte %: 5 %
Neutrophil #: 2.4 10*3/uL (ref 1.4–6.5)
Neutrophil %: 67.4 %
Platelet: 176 10*3/uL (ref 150–440)
RBC: 3.95 10*6/uL — ABNORMAL LOW (ref 4.40–5.90)
RDW: 16.7 % — ABNORMAL HIGH (ref 11.5–14.5)

## 2011-12-27 LAB — TROPONIN I: Troponin-I: 0.85 ng/mL — ABNORMAL HIGH

## 2011-12-27 LAB — CK TOTAL AND CKMB (NOT AT ARMC)
CK, Total: 269 U/L — ABNORMAL HIGH (ref 35–232)
CK-MB: 3.1 ng/mL (ref 0.5–3.6)

## 2011-12-27 LAB — PRO B NATRIURETIC PEPTIDE: B-Type Natriuretic Peptide: 15300 pg/mL — ABNORMAL HIGH (ref 0–450)

## 2011-12-28 DIAGNOSIS — R0602 Shortness of breath: Secondary | ICD-10-CM

## 2011-12-28 DIAGNOSIS — I369 Nonrheumatic tricuspid valve disorder, unspecified: Secondary | ICD-10-CM

## 2011-12-28 LAB — CBC WITH DIFFERENTIAL/PLATELET
Basophil #: 0 10*3/uL (ref 0.0–0.1)
Eosinophil #: 0 10*3/uL (ref 0.0–0.7)
Eosinophil %: 0 %
Lymphocyte #: 0.4 10*3/uL — ABNORMAL LOW (ref 1.0–3.6)
Lymphocyte %: 14.3 %
Monocyte %: 0.9 %
Neutrophil %: 84.6 %
Platelet: 156 10*3/uL (ref 150–440)
RBC: 3.92 10*6/uL — ABNORMAL LOW (ref 4.40–5.90)
WBC: 2.9 10*3/uL — ABNORMAL LOW (ref 3.8–10.6)

## 2011-12-28 LAB — HEPATIC FUNCTION PANEL A (ARMC)
Albumin: 2.9 g/dL — ABNORMAL LOW (ref 3.4–5.0)
Bilirubin, Direct: 0.1 mg/dL (ref 0.00–0.20)
Bilirubin,Total: 0.4 mg/dL (ref 0.2–1.0)
SGOT(AST): 1046 U/L — ABNORMAL HIGH (ref 15–37)
SGPT (ALT): 782 U/L — ABNORMAL HIGH
Total Protein: 7.8 g/dL (ref 6.4–8.2)

## 2011-12-28 LAB — BASIC METABOLIC PANEL
Anion Gap: 10 (ref 7–16)
Calcium, Total: 8 mg/dL — ABNORMAL LOW (ref 8.5–10.1)
Co2: 33 mmol/L — ABNORMAL HIGH (ref 21–32)
Creatinine: 1.16 mg/dL (ref 0.60–1.30)
EGFR (African American): >60
EGFR (Non-African Amer.): >60
Glucose: 147 mg/dL — ABNORMAL HIGH (ref 65–99)
Sodium: 140 mmol/L (ref 136–145)

## 2011-12-28 LAB — CK TOTAL AND CKMB (NOT AT ARMC)
CK, Total: 207 U/L (ref 35–232)
CK-MB: 2.1 ng/mL (ref 0.5–3.6)

## 2011-12-28 LAB — MAGNESIUM: Magnesium: 2.2 mg/dL

## 2011-12-28 LAB — TROPONIN I
Troponin-I: 0.37 ng/mL — ABNORMAL HIGH
Troponin-I: 0.31 ng/mL — ABNORMAL HIGH

## 2011-12-28 LAB — LIPID PANEL
Cholesterol: 107 mg/dL (ref 0–200)
HDL Cholesterol: 26 mg/dL — ABNORMAL LOW (ref 40–60)
Ldl Cholesterol, Calc: 70 mg/dL (ref 0–100)
Triglycerides: 56 mg/dL (ref 0–200)

## 2011-12-30 LAB — COMPREHENSIVE METABOLIC PANEL
Anion Gap: 9 (ref 7–16)
BUN: 30 mg/dL — ABNORMAL HIGH (ref 7–18)
Chloride: 96 mmol/L — ABNORMAL LOW (ref 98–107)
Creatinine: 0.85 mg/dL (ref 0.60–1.30)
EGFR (African American): >60
EGFR (Non-African Amer.): >60
Glucose: 139 mg/dL — ABNORMAL HIGH (ref 65–99)
SGOT(AST): 1205 U/L — ABNORMAL HIGH (ref 15–37)
SGPT (ALT): 1574 U/L — ABNORMAL HIGH
Sodium: 141 mmol/L (ref 136–145)

## 2011-12-31 LAB — COMPREHENSIVE METABOLIC PANEL
Albumin: 2.6 g/dL — ABNORMAL LOW (ref 3.4–5.0)
Alkaline Phosphatase: 54 U/L (ref 50–136)
Anion Gap: 5 — ABNORMAL LOW (ref 7–16)
BUN: 29 mg/dL — ABNORMAL HIGH (ref 7–18)
Calcium, Total: 8.4 mg/dL — ABNORMAL LOW (ref 8.5–10.1)
Chloride: 96 mmol/L — ABNORMAL LOW (ref 98–107)
EGFR (Non-African Amer.): >60
Glucose: 147 mg/dL — ABNORMAL HIGH (ref 65–99)
SGOT(AST): 413 U/L — ABNORMAL HIGH (ref 15–37)
Sodium: 140 mmol/L (ref 136–145)
Total Protein: 7.8 g/dL (ref 6.4–8.2)

## 2012-01-02 ENCOUNTER — Other Ambulatory Visit: Payer: Self-pay | Admitting: Internal Medicine

## 2012-01-02 LAB — CBC WITH DIFFERENTIAL/PLATELET
Basophil #: 0 10*3/uL (ref 0.0–0.1)
Basophil %: 0 %
Eosinophil %: 0 %
HCT: 36.2 % — ABNORMAL LOW (ref 40.0–52.0)
HGB: 11.6 g/dL — ABNORMAL LOW (ref 13.0–18.0)
Lymphocyte #: 0.6 10*3/uL — ABNORMAL LOW (ref 1.0–3.6)
Lymphocyte %: 8.6 %
Monocyte %: 6 %
Neutrophil #: 5.7 10*3/uL (ref 1.4–6.5)
Neutrophil %: 85.4 %
RBC: 4.2 10*6/uL — ABNORMAL LOW (ref 4.40–5.90)
RDW: 16.1 % — ABNORMAL HIGH (ref 11.5–14.5)
WBC: 6.7 10*3/uL (ref 3.8–10.6)

## 2012-01-02 LAB — HEPATIC FUNCTION PANEL A (ARMC)
Albumin: 2.4 g/dL — ABNORMAL LOW (ref 3.4–5.0)
SGOT(AST): 80 U/L — ABNORMAL HIGH (ref 15–37)
SGPT (ALT): 575 U/L — ABNORMAL HIGH
Total Protein: 7.5 g/dL (ref 6.4–8.2)

## 2012-01-03 ENCOUNTER — Ambulatory Visit: Payer: Medicare Other | Admitting: Pulmonary Disease

## 2012-01-03 ENCOUNTER — Telehealth: Payer: Self-pay | Admitting: *Deleted

## 2012-01-03 ENCOUNTER — Other Ambulatory Visit: Payer: Self-pay | Admitting: Pulmonary Disease

## 2012-01-03 ENCOUNTER — Encounter: Payer: Self-pay | Admitting: Gastroenterology

## 2012-01-03 DIAGNOSIS — J8410 Pulmonary fibrosis, unspecified: Secondary | ICD-10-CM

## 2012-01-03 MED ORDER — HYDROCOD POLST-CHLORPHEN POLST 10-8 MG/5ML PO LQCR: 5.0000 mL | Freq: Two times a day (BID) | ORAL | Status: DC | PRN

## 2012-01-03 NOTE — Telephone Encounter (Signed)
Called and spoke with [name] and informed her of Dr. Ulyses Jarred response/recs.  She is aware rx sent to pharmacy and order for humidifer sent to [facility].

## 2012-01-03 NOTE — Telephone Encounter (Signed)
Please Rx Tussionex 1 tsp q 12 hours prn, dispense . Refill #1 OK to Rx for humidified O2 through [facility].

## 2012-01-03 NOTE — Telephone Encounter (Signed)
Called and spoke with pt's family member [name].  She states pt was just d/c'd from [facility] yesterday.  She states pt has pulm fibrosis and they were sent home without rx for Tussionex.  [name] is requesting an rx for this as she states pt had a "very difficult time sleeping last night" d/t increased coughing.  [name] states pt's cough is tolerable throughout the day when he is upright.   Also she states pt is needing an order sent to [facility] for humidifier for his oxygen.     Please advise.  Thanks!   Allergies  Allergen Reactions  . Penicillins   . Sulfonamide Derivatives     REACTION: itching

## 2012-01-03 NOTE — Telephone Encounter (Signed)
Okay  Does he have follow up scheduled? Was that the only labs needed?

## 2012-01-03 NOTE — Telephone Encounter (Signed)
Rx done electronically 

## 2012-01-03 NOTE — Telephone Encounter (Signed)
Patient was discharged from the hospital on 01/02/2012 and was given an order to have LFTs done.  Is it ok for nurse to draw these labs at her next visit on Wednesday or Thursday of this week?  Please advise.

## 2012-01-04 NOTE — Telephone Encounter (Signed)
Left message for nurse to draw labs, asked her to call if more labs need to be done. Pt's next appt is 03/20/12, pt does have appt with Dr.McQuaid on 01/06/12.

## 2012-01-05 NOTE — Telephone Encounter (Signed)
Yes, I spoke to his daughter  I did ask her to set up appt here next week, or the week after Please set this up

## 2012-01-06 ENCOUNTER — Ambulatory Visit (INDEPENDENT_AMBULATORY_CARE_PROVIDER_SITE_OTHER): Payer: Medicare Other | Admitting: Pulmonary Disease

## 2012-01-06 ENCOUNTER — Encounter: Payer: Self-pay | Admitting: Pulmonary Disease

## 2012-01-06 ENCOUNTER — Ambulatory Visit: Payer: Medicare Other | Admitting: Pulmonary Disease

## 2012-01-06 VITALS — BP 100/58 | HR 87 | Temp 98.1°F | Ht 72.0 in | Wt 214.0 lb

## 2012-01-06 DIAGNOSIS — K7290 Hepatic failure, unspecified without coma: Secondary | ICD-10-CM

## 2012-01-06 DIAGNOSIS — D649 Anemia, unspecified: Secondary | ICD-10-CM

## 2012-01-06 DIAGNOSIS — K769 Liver disease, unspecified: Secondary | ICD-10-CM

## 2012-01-06 DIAGNOSIS — J9610 Chronic respiratory failure, unspecified whether with hypoxia or hypercapnia: Secondary | ICD-10-CM

## 2012-01-06 DIAGNOSIS — J8410 Pulmonary fibrosis, unspecified: Secondary | ICD-10-CM

## 2012-01-06 DIAGNOSIS — I359 Nonrheumatic aortic valve disorder, unspecified: Secondary | ICD-10-CM

## 2012-01-06 DIAGNOSIS — J189 Pneumonia, unspecified organism: Secondary | ICD-10-CM

## 2012-01-06 NOTE — Assessment & Plan Note (Addendum)
As noted before I am uncertain if Brett [name]'s diffuse parenchymal lung disease is UIP.  I favor a diagnosis of chronic aspiration.  Regardless, his disease has progressed.  I don't think that the prednisone has helped so I am going to have him taper this off over the next month.  To note, this is the only time his lung disease has been treated with steroids to my knowledge.  He would like to have a second opinion at [facility] with Marcelene Butte per my suggestion.  The major clinical question I have is whether or not this is UIP or if more prednisone or other immunosuppressants are merited.  Plan: -refer to Dr. Jon Billings at [facility] -Continue pulmonary rehab -continue to monitor for aspiration -d/c Advair/Spiriva -guaifenesin for cough -CXR tomorrow

## 2012-01-06 NOTE — Telephone Encounter (Signed)
Patient has appointment with Dr.Letvak on 01/07/12.

## 2012-01-06 NOTE — Assessment & Plan Note (Signed)
Apparently his LFT's were elevated while hospitalized at [facility].  Again, I have no records to guide me here.   Plan: -order CMP today per discharge instructions -f/u with Dr. Alphonsus Sias tomorrow

## 2012-01-06 NOTE — Patient Instructions (Signed)
We will send you for a chest x-ray tomorrow at the [facility]. Have the staff there call us after the X-ray. Take guaifenesin (mucinex) as needed for cough. Decrease the prednisone to 20mg  daily for one week, then decrease it to 10mg  daily for one week, then decrease it to 5mg  daily for one week, then stop taking it. Stop taking the advair and the spiriva. We will refer you to [facility] for a second opinion about your pulmonary fibrosis. Continue participating in pulmonary rehab.  We will see you back in 1 month.

## 2012-01-06 NOTE — Assessment & Plan Note (Signed)
He seems to be recovering but still has a lot of mucus / rhonchi on exam.    Plan: -CXR tomorrow -if persistent infiltrate I will order antibiotics for another week -otherwise will add an OTC mucolytic

## 2012-01-06 NOTE — Progress Notes (Signed)
Subjective:    Patient ID: Brett [name], male    DOB: 10/27/1933, 78 y.o.   MRN: [id number]  Synopsis: Brett [name] is a very pleasant male with diffuse parenchymal lung disease who has been followed by [facility] since 2008.  At that time he was seen by Dr. [name] for shortness of breath and a dry cough.  He was seen by Dr. [name] who felt that his lung disease was due to chronic aspiration.  At that time a spartan serologic work up was negative.  Between 2008 and 2011 he was able to walk 3-4 miles a day. He went on oxygen in 2011.  In 2012 his exercise tolerance began to decline and his aspiration symptoms were worse.  He underwent an esophageal balloon dilation in 10/2011.  He was hospitalized for pneumonia in 12/2011.  HPI  09/29/11 2 week return visit:  He returns today for follow up on his diffuse parenchymal lung disease. He has undergone a barium swallow and a repeat CT scan, see below. He has had no change in his cough and shortness of breath. He is not exercising.  He also notes left lower quadrant cramping abdominal pain for one day. No fever, chills, nausea, vomiting, diarrhea. He says that this feels like his prior episodes of diverticulitis.  11/26/11 ROV -- Brett [name] returns for a follow up on his pulmonary fibrosis believed to be due to aspiration.  He underwent a balloon dilation of his esophagus this week after the esophogram showed a distal stricture.  I don't have the report from this yet. He is participating in pulmonary rehab and feels like it is going well.  He states that his breathing is about the same as when I saw him before.  He has lost a few pounds intentionally. His cough is at baseline.  He is planning a trip to [place] soon.  01/06/12 ROV-- Brett [name] was recently hosptialized after his trip to [place].  He developed a cough with sputum production and shortness of breath.  He was diagnosed with pneumonia, fluid overload, and apparently liver dysfunction (I do  not have a discharge summary).  Apparently he stayed in the ICU for several days on high flow oxygen and was treated with antibiotics, steroids, and diuretics  He was discharged with new prescriptions for Advair and Spiriva and 30mg  daily of prednisone.  His cough persists but he does not produce sputum.  He feels better but is clearly weaker than prior to his hospitalization.  He has started working in pulmonary rehab again at [facility].  His oxygen requirements are now higher at 6L/min  Past Medical History  Diagnosis Date  . Allergy   . Hx of colonic polyps   . Diverticulitis   . Gout   . Hypertension   . Arthritis   . Hyperlipidemia   . GERD (gastroesophageal reflux disease)   . Pulmonary fibrosis   . Aortic stenosis   . Carotid stenosis   . Cataract      Review of Systems  Constitutional: Positive for fatigue. Negative for fever, chills and unexpected weight change.  Respiratory: Positive for cough and shortness of breath. Negative for choking, chest tightness and wheezing.   Cardiovascular: Negative for chest pain, palpitations and leg swelling.    Objective:   Physical Exam   Filed Vitals:   01/06/12 1411  BP: 100/58  Pulse: 87  Temp: 98.1 F (36.7 C)  TempSrc: Oral  Height: 6' (1.829 m)  Weight: 214 lb (97.07 kg)  SpO2: 98%    Gen: chronically ill, overweight white male HEENT: NCAT, PERRL, EOMi, OP clear, neck supple without masses PULM: Insp crackles, wheezes noted throughout, more rhonchi in the left base on my exam today CV: RRR, no mgr, no JVD Ext: warm, trace edema, notable for clubbing, no cyanosis Neuro: A&Ox4, MAEW   Full PFT's from 07/2010: FEV1 1.84, (63%), Raio 86%, TLC 7.07 (105%), DLCO 41%  CT Thorax from 09/2011 showed progressive fibrosis compared to the 2008 study.   Radiology felt that he had traction bronchiectasis and honeycombing in the bases and periphery which radiology felt was due to UIP.  I think that he has clear bronchiectasis and  interstitial thickening but the honeycombing is less discreet.  I am uncertain if this represents a UIP pattern.     Assessment & Plan:   PULMONARY FIBROSIS As noted before I am uncertain if Brett [name]'s diffuse parenchymal lung disease is UIP.  I favor a diagnosis of chronic aspiration.  Regardless, his disease has progressed.  I don't think that the prednisone has helped so I am going to have him taper this off over the next month.  To note, this is the only time his lung disease has been treated with steroids to my knowledge.  He would like to have a second opinion at [facility] with Marcelene Butte per my suggestion.  The major clinical question I have is whether or not this is UIP or if more prednisone or other immunosuppressants are merited.  Plan: -refer to Dr. Jon Billings at [facility] -Continue pulmonary rehab -continue to monitor for aspiration -d/c Advair/Spiriva -guaifenesin for cough -CXR tomorrow   Pneumonia He seems to be recovering but still has a lot of mucus / rhonchi on exam.    Plan: -CXR tomorrow -if persistent infiltrate I will order antibiotics for another week -otherwise will add an OTC mucolytic  Liver failure Apparently his LFT's were elevated while hospitalized at [facility].  Again, I have no records to guide me here.   Plan: -order CMP today per discharge instructions -f/u with Dr. Alphonsus Sias tomorrow     Updated Medication List Outpatient Encounter Prescriptions as of 01/06/2012  Medication Sig Dispense Refill  . aspirin 81 MG tablet Take 81 mg by mouth daily.        Jennette Banker Sodium 30-100 MG CAPS Take 1 tablet by mouth 2 (two) times daily.      . cetirizine (ZYRTEC) 10 MG tablet Take 10 mg by mouth daily.        . Cholecalciferol (VITAMIN D3) 1000 UNITS CAPS Take by mouth daily.        . colchicine 0.6 MG tablet Take 0.6 mg by mouth daily.        Marland Kitchen etodolac (LODINE) 500 MG tablet TAKE 1 TABLET DAILY  90 tablet  1  . Fluticasone-Salmeterol (ADVAIR DISKUS)  250-50 MCG/DOSE AEPB Inhale 1 puff into the lungs every 12 (twelve) hours.      . furosemide (LASIX) 20 MG tablet Take 20 mg by mouth daily.      . metoprolol tartrate (LOPRESSOR) 25 MG tablet Take 25 mg by mouth 2 (two) times daily.      . Multiple Vitamin (MULTIVITAMIN) capsule Take 1 capsule by mouth daily.        . Multiple Vitamins-Minerals (OCUVITE PRESERVISION) TABS Take 1 tablet by mouth daily.        Marland Kitchen omeprazole (PRILOSEC) 20 MG capsule TAKE ONE CAPSULE BY MOUTH TWICE A DAY  60 capsule  12  . pravastatin (PRAVACHOL) 40 MG tablet Take 40 mg by mouth daily.      . predniSONE (DELTASONE) 20 MG tablet Take 30 mg by mouth daily.      Marland Kitchen tiotropium (SPIRIVA) 18 MCG inhalation capsule Place 18 mcg into inhaler and inhale daily.      . traMADol (ULTRAM) 50 MG tablet TAKE 1 TABLET (50 MG TOTAL) BY MOUTH EVERY 8 (EIGHT) HOURS AS NEEDED FOR PAIN.  90 tablet  0  . vitamin B-12 (CYANOCOBALAMIN) 1000 MCG tablet Take 1,000 mcg by mouth daily.        Marland Kitchen DISCONTD: chlorpheniramine-HYDROcodone (TUSSIONEX) 10-8 MG/5ML LQCR Take 5 mLs by mouth every 12 (twelve) hours as needed (cough ).  200 mL  1  . DISCONTD: famotidine (PEPCID) 20 MG tablet Take 20 mg by mouth at bedtime.        Marland Kitchen DISCONTD: pravastatin (PRAVACHOL) 40 MG tablet TAKE 1 TABLET DAILY  90 tablet  1  . DISCONTD: traMADol (ULTRAM) 50 MG tablet Take 1 tablet (50 mg total) by mouth every 6 (six) hours as needed.  90 tablet  0

## 2012-01-07 ENCOUNTER — Telehealth: Payer: Self-pay | Admitting: Pulmonary Disease

## 2012-01-07 ENCOUNTER — Ambulatory Visit (INDEPENDENT_AMBULATORY_CARE_PROVIDER_SITE_OTHER): Payer: Medicare Other | Admitting: Internal Medicine

## 2012-01-07 ENCOUNTER — Ambulatory Visit (INDEPENDENT_AMBULATORY_CARE_PROVIDER_SITE_OTHER)
Admission: RE | Admit: 2012-01-07 | Discharge: 2012-01-07 | Disposition: A | Discharge status: Home / Self Care | Payer: Medicare Other | Source: Ambulatory Visit | Attending: Pulmonary Disease | Admitting: Pulmonary Disease

## 2012-01-07 ENCOUNTER — Encounter: Payer: Self-pay | Admitting: Internal Medicine

## 2012-01-07 VITALS — BP 130/60 | HR 93 | Temp 98.1°F | Ht 72.0 in | Wt 198.0 lb

## 2012-01-07 DIAGNOSIS — J8410 Pulmonary fibrosis, unspecified: Secondary | ICD-10-CM

## 2012-01-07 DIAGNOSIS — K7290 Hepatic failure, unspecified without coma: Secondary | ICD-10-CM

## 2012-01-07 DIAGNOSIS — K769 Liver disease, unspecified: Secondary | ICD-10-CM

## 2012-01-07 DIAGNOSIS — I272 Pulmonary hypertension, unspecified: Secondary | ICD-10-CM | POA: Insufficient documentation

## 2012-01-07 DIAGNOSIS — J9610 Chronic respiratory failure, unspecified whether with hypoxia or hypercapnia: Secondary | ICD-10-CM

## 2012-01-07 DIAGNOSIS — I2789 Other specified pulmonary heart diseases: Secondary | ICD-10-CM

## 2012-01-07 DIAGNOSIS — I359 Nonrheumatic aortic valve disorder, unspecified: Secondary | ICD-10-CM

## 2012-01-07 LAB — COMPREHENSIVE METABOLIC PANEL
BUN: 20 mg/dL (ref 6–23)
CO2: 40 mEq/L — ABNORMAL HIGH (ref 19–32)
Calcium: 8.8 mg/dL (ref 8.4–10.5)
Chloride: 92 mEq/L — ABNORMAL LOW (ref 96–112)
Creatinine, Ser: 0.7 mg/dL (ref 0.4–1.5)
GFR: 116.62 mL/min (ref >60.00)

## 2012-01-07 NOTE — Assessment & Plan Note (Signed)
Murmur not evident Could be related to index syncopal spell leading to hospitalization We need to be careful about diuresis

## 2012-01-07 NOTE — Assessment & Plan Note (Signed)
Dr Henrene Pastor is following  Setting up second opinion--fibrosis vs chronic aspiration

## 2012-01-07 NOTE — Progress Notes (Signed)
Subjective:    Patient ID: Brett [name], male    DOB: 07/23/1934, 78 y.o.   MRN: [id number]  HPI Here for follow up after hospitalization Had severe respiratory failure and pulmonary hypertension Multiple organ failure---liver esp---probably congestive Diuresed tremendously  Weighs himself daily Doesn't know the number now--but he writes it down  Breathing is better now Saw Dr Kendrick Fries yesterday Setting up second opinion about the pulmonary process at [facility] Stopped the new inhalers Not sure if steroids are beneficial Started on mucinex No fever now  No chest pain No palpitations No dizziness now. Did pass out which prompted this hospitalization  Current Outpatient Prescriptions on File Prior to Visit  Medication Sig Dispense Refill  . aspirin 81 MG tablet Take 81 mg by mouth daily.        Jennette Banker Sodium 30-100 MG CAPS Take 1 tablet by mouth 2 (two) times daily.      . cetirizine (ZYRTEC) 10 MG tablet Take 10 mg by mouth daily.        . Cholecalciferol (VITAMIN D3) 1000 UNITS CAPS Take by mouth daily.        . colchicine 0.6 MG tablet Take 0.6 mg by mouth daily.        Marland Kitchen etodolac (LODINE) 500 MG tablet TAKE 1 TABLET DAILY  90 tablet  1  . furosemide (LASIX) 20 MG tablet Take 20 mg by mouth daily.      . metoprolol tartrate (LOPRESSOR) 25 MG tablet Take 25 mg by mouth 2 (two) times daily.      . Multiple Vitamin (MULTIVITAMIN) capsule Take 1 capsule by mouth daily.        . Multiple Vitamins-Minerals (OCUVITE PRESERVISION) TABS Take 1 tablet by mouth daily.        Marland Kitchen omeprazole (PRILOSEC) 20 MG capsule TAKE ONE CAPSULE BY MOUTH TWICE A DAY  60 capsule  12  . pravastatin (PRAVACHOL) 40 MG tablet Take 40 mg by mouth daily.      . predniSONE (DELTASONE) 20 MG tablet Take 30 mg by mouth daily.      . traMADol (ULTRAM) 50 MG tablet TAKE 1 TABLET (50 MG TOTAL) BY MOUTH EVERY 8 (EIGHT) HOURS AS NEEDED FOR PAIN.  90 tablet  0  . vitamin B-12 (CYANOCOBALAMIN) 1000 MCG  tablet Take 1,000 mcg by mouth daily.          Allergies  Allergen Reactions  . Penicillins   . Sulfonamide Derivatives     REACTION: itching    Past Medical History  Diagnosis Date  . Allergy   . Hx of colonic polyps   . Diverticulitis   . Gout   . Hypertension   . Arthritis   . Hyperlipidemia   . GERD (gastroesophageal reflux disease)   . Pulmonary fibrosis   . Aortic stenosis   . Carotid stenosis   . Cataract     Past Surgical History  Procedure Date  . Anomalous pulmonary venous return repair   . Cardiac catheterization   . Melanoma excision     shoulder    Family History  Problem Relation Age of Onset  . Diabetes Mother   . Cancer Sister     breast  . Diabetes Maternal Grandmother     History   Social History  . Marital Status: Married    Spouse Name: N/A    Number of Children: 1  . Years of Education: N/A   Occupational History  . retired Proofreader  Social History Main Topics  . Smoking status: Former Smoker -- 1.0 packs/day for 5 years    Types: Cigarettes    Quit date: 10/19/1963  . Smokeless tobacco: Never Used   Comment: quit in the 60's  . Alcohol Use: Yes     rarely  . Drug Use: No  . Sexually Active: Not on file   Other Topics Concern  . Not on file   Social History Narrative   Retired Engineer, site.  No mold in home, no cedar in or around home.  No exposure to chemicals, dusts, asbestos, pesticides.  Smoked very briefly (<4 years) in teenage years.     Review of Systems Appetite is okay Bowels are every other day---no straining. Occ red blood on toilet paper. Stool color normal    Objective:   Physical Exam  Constitutional: He appears well-developed. No distress.  Neck: Normal range of motion.  Cardiovascular: Normal rate.  Exam reveals no gallop.   No murmur heard.      occ skips  S2 seems widely split and fixed  Pulmonary/Chest:       Bronchial sounds at bases. ?slight crackles at bases    Musculoskeletal: He exhibits no edema and no tenderness.  Lymphadenopathy:    He has no cervical adenopathy.  Psychiatric: He has a normal mood and affect. His behavior is normal.          Assessment & Plan:

## 2012-01-07 NOTE — Assessment & Plan Note (Signed)
Causing respiratory and multiorgan failure Probably due to primary pulmonary process---still unclear diagnosis Better now with massive diuresis He will bring in his weight chart and we will cater diuretic therapy to this

## 2012-01-07 NOTE — Telephone Encounter (Signed)
I called him to let him know that his CXR was essentially unchanged.

## 2012-01-07 NOTE — Assessment & Plan Note (Signed)
Labs redrawn Expect improvement with the diuresis

## 2012-01-10 ENCOUNTER — Encounter: Payer: Self-pay | Admitting: Internal Medicine

## 2012-01-10 ENCOUNTER — Telehealth: Payer: Self-pay | Admitting: *Deleted

## 2012-01-10 NOTE — Telephone Encounter (Signed)
Spoke with pt and notified of results per Dr.McQuaid. Pt verbalized understanding and denied any questions. 

## 2012-01-10 NOTE — Telephone Encounter (Signed)
Message copied by Christen Butter on Mon Jan 10, 2012 10:12 AM ------      Message from: Tanna Savoy      Created: Sat Jan 08, 2012  6:23 PM       L,            Can you let him know that his LFT's were ok on the blood work we sent on Thursday?            Thanks,      B

## 2012-01-12 ENCOUNTER — Encounter: Payer: Self-pay | Admitting: *Deleted

## 2012-01-13 ENCOUNTER — Encounter: Payer: Self-pay | Admitting: Internal Medicine

## 2012-01-13 ENCOUNTER — Ambulatory Visit (INDEPENDENT_AMBULATORY_CARE_PROVIDER_SITE_OTHER): Payer: Medicare Other | Admitting: Internal Medicine

## 2012-01-13 VITALS — BP 128/70 | HR 97 | Temp 98.2°F | Wt 196.0 lb

## 2012-01-13 DIAGNOSIS — I2789 Other specified pulmonary heart diseases: Secondary | ICD-10-CM

## 2012-01-13 DIAGNOSIS — I272 Pulmonary hypertension, unspecified: Secondary | ICD-10-CM

## 2012-01-13 NOTE — Patient Instructions (Signed)
Please don't take the furosemide on any morning your weight is under 188#

## 2012-01-13 NOTE — Assessment & Plan Note (Signed)
Seems to have stabilized Will hold diuretics if his home weight is under 188#  Now with money worries---bought more points from [employer] and now regrets. Did this while down in [place] as he was getting sick. I offered to write letter that his health may have affected his judgement at that time

## 2012-01-13 NOTE — Progress Notes (Signed)
Subjective:    Patient ID: Brett [name], male    DOB: May 26, 1934, 78 y.o.   MRN: [id number]  HPI Feels "not too bad"  Has noted some trouble with "mental focus" Can't stay "on track" Has not been sleeping well Worried about money issues-- but they do have money  Daily weights 189.4-192.8 No edema Breathing is okay--uses up a lot of oxygen when out of home since on 6 liters/min all the time  No chest pain Occ gets sense of heart skipping---not other symptoms  Current Outpatient Prescriptions on File Prior to Visit  Medication Sig Dispense Refill  . aspirin 81 MG tablet Take 81 mg by mouth daily.        Jennette Banker Sodium 30-100 MG CAPS Take 1 tablet by mouth 2 (two) times daily.      . cetirizine (ZYRTEC) 10 MG tablet Take 10 mg by mouth daily.        . Cholecalciferol (VITAMIN D3) 1000 UNITS CAPS Take by mouth daily.        . colchicine 0.6 MG tablet Take 0.6 mg by mouth daily.        Marland Kitchen etodolac (LODINE) 500 MG tablet TAKE 1 TABLET DAILY  90 tablet  1  . furosemide (LASIX) 20 MG tablet Take 20 mg by mouth daily.      . metoprolol tartrate (LOPRESSOR) 25 MG tablet Take 25 mg by mouth 2 (two) times daily.      . Multiple Vitamin (MULTIVITAMIN) capsule Take 1 capsule by mouth daily.        . Multiple Vitamins-Minerals (OCUVITE PRESERVISION) TABS Take 1 tablet by mouth daily.        Marland Kitchen omeprazole (PRILOSEC) 20 MG capsule TAKE ONE CAPSULE BY MOUTH TWICE A DAY  60 capsule  12  . pravastatin (PRAVACHOL) 40 MG tablet Take 40 mg by mouth daily.      . predniSONE (DELTASONE) 20 MG tablet Take 30 mg by mouth daily.      . traMADol (ULTRAM) 50 MG tablet TAKE 1 TABLET (50 MG TOTAL) BY MOUTH EVERY 8 (EIGHT) HOURS AS NEEDED FOR PAIN.  90 tablet  0  . vitamin B-12 (CYANOCOBALAMIN) 1000 MCG tablet Take 1,000 mcg by mouth daily.        Marland Kitchen DISCONTD: famotidine (PEPCID) 20 MG tablet Take 20 mg by mouth at bedtime.          Allergies  Allergen Reactions  . Penicillins   . Sulfonamide  Derivatives     REACTION: itching    Past Medical History  Diagnosis Date  . Allergy   . Hx of colonic polyps   . Diverticulitis   . Gout   . Hypertension   . Arthritis   . Hyperlipidemia   . GERD (gastroesophageal reflux disease)   . Pulmonary fibrosis   . Aortic stenosis   . Carotid stenosis   . Cataract     Past Surgical History  Procedure Date  . Anomalous pulmonary venous return repair   . Cardiac catheterization   . Melanoma excision     shoulder    Family History  Problem Relation Age of Onset  . Diabetes Mother   . Cancer Sister     breast  . Diabetes Maternal Grandmother     History   Social History  . Marital Status: Married    Spouse Name: N/A    Number of Children: 1  . Years of Education: N/A   Occupational History  . 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 Smoking status: Former Smoker -- 1.0 packs/day for 5 years    Types: Cigarettes    Quit date: 10/19/1963  . Smokeless tobacco: Never Used   Comment: quit in the 60's  . Alcohol Use: Yes     rarely  . Drug Use: No  . Sexually Active: Not on file   Other Topics Concern  . Not on file   Social History Narrative   Retired Engineer, site.  No mold in home, no cedar in or around home.  No exposure to chemicals, dusts, asbestos, pesticides.  Smoked very briefly (<4 years) in teenage years.     Review of Systems Appetite isn't great---can't eat much at a time Weight is down 2#     Objective:   Physical Exam  Constitutional: He appears well-developed. No distress.  Neck: Normal range of motion.  Cardiovascular: Normal rate and regular rhythm.   No murmur heard.      S2 is still widely split but no longer fixed  Pulmonary/Chest: Effort normal. No respiratory distress. He has no wheezes. He has rales.       Bronchial sounds at the bases with crackles up at least halfway  Musculoskeletal: He exhibits no edema and no tenderness.  Lymphadenopathy:    He has  no cervical adenopathy.          Assessment & Plan:

## 2012-01-17 ENCOUNTER — Other Ambulatory Visit: Payer: Self-pay | Admitting: Internal Medicine

## 2012-01-17 ENCOUNTER — Ambulatory Visit: Payer: Self-pay | Admitting: Internal Medicine

## 2012-01-17 ENCOUNTER — Encounter: Payer: Self-pay | Admitting: Internal Medicine

## 2012-01-17 NOTE — Telephone Encounter (Signed)
Ok to refill 

## 2012-01-17 NOTE — Telephone Encounter (Signed)
rx sent to Dr. Kendrick Fries, spoke with patient and advised, pt states he spoke with nurse today and he should still be taking.

## 2012-01-17 NOTE — Telephone Encounter (Signed)
This is prescribed by the pulmonologist but I don't mind refilling if the dose is correct #90 x 2

## 2012-01-18 ENCOUNTER — Telehealth: Payer: Self-pay

## 2012-01-18 MED ORDER — HYDROCORTISONE ACETATE 25 MG RE SUPP: 25.0000 mg | Freq: Two times a day (BID) | RECTAL | Status: DC | PRN

## 2012-01-18 MED ORDER — METOPROLOL TARTRATE 25 MG PO TABS: 25.0000 mg | ORAL_TABLET | Freq: Two times a day (BID) | ORAL | Status: DC

## 2012-01-18 NOTE — Telephone Encounter (Signed)
Have him try anusol HC suppositories Try bid for a week to see if it reduces the size and helps keep it from coming out Then can use bid prn  #30 x 1

## 2012-01-18 NOTE — Telephone Encounter (Signed)
Spoke with spouse to ensure he is tapering off as directed per last ov and she states that he is. I refilled med x 1 only x 10 tablets. Spouse states nothing further needed.

## 2012-01-18 NOTE — Telephone Encounter (Signed)
Jasmine December nurse with [facility] said hospitalist started pt on Metoprolol 25 mg but did not give refills when pt discharged from hospital. Pt was seen by Dr Alphonsus Sias on 01/13/12. Metoprolol 25 mg is on pts med list but Jasmine December wants to know if Dr Alphonsus Sias wants pt to continue taking Metoprolol or discontinue it. If pt is to continue please send [facility] [address]. Geraldine Contras said to send note to Dr Alphonsus Sias. Jasmine December can be reached at [phone].

## 2012-01-18 NOTE — Telephone Encounter (Signed)
Spoke with patient and advised results rx sent to pharmacy by e-script  

## 2012-01-18 NOTE — Telephone Encounter (Signed)
Continue the metoprolol as well---send Rx

## 2012-01-18 NOTE — Telephone Encounter (Signed)
Caller: Tymeir/Patient; PCP: Tillman Abide I.; CB#: [phone];  Calling today 01/18/12 regarding Hemorrhoids; Onset 01/15/12.  Has home health nurse that comes twice per week.  Has been using Preperation H.  This helps until he has a BM, then it comes back out again.  Says it is about the size of small grape. Afebrile.  Does not seem to hurt or itch.  Emergent symptoms r/o by Rectal Symptoms guidelines with exception of all other situations.  Pt requesting note be sent to MD to see if prescription hemorrhoid cream can be called in.  [facility] [address] [phone].  PLEASE CALL PT BACK AT [phone] TO LET HIM KNOW IF SOMETHING CAN BE CALLED IN FOR HIM.

## 2012-01-18 NOTE — Telephone Encounter (Signed)
OK for my office to refill, however Triage please check with the patient to make sure that he knows to be tapering off of his prednisone as per my instructions from the last clinic visit with me.

## 2012-01-19 ENCOUNTER — Telehealth: Payer: Self-pay | Admitting: Pulmonary Disease

## 2012-01-19 NOTE — Telephone Encounter (Signed)
Spoke with pt and he seemed confused about directions for Prednisone.  Pt states that new rx for Prednsione 10mg  was sent to pharmacy yesterday.  Per Dr Ulyses Jarred instructions at last ov pt was to taper down on Prednisone.  Pt instructed to take Prednisone 10 mg one tablet daily for 7 days then 1/2 tablet daily for seven days then stop Prednisone.  Pt verbalized understanding of this.  Confirmed pt's appt with Dr Kendrick Fries on 01/31/12 in [place] office.

## 2012-01-24 ENCOUNTER — Ambulatory Visit (INDEPENDENT_AMBULATORY_CARE_PROVIDER_SITE_OTHER): Payer: Medicare Other | Admitting: Internal Medicine

## 2012-01-24 ENCOUNTER — Encounter: Payer: Self-pay | Admitting: Internal Medicine

## 2012-01-24 VITALS — BP 120/60 | HR 89 | Temp 98.3°F | Wt 195.0 lb

## 2012-01-24 DIAGNOSIS — K644 Residual hemorrhoidal skin tags: Secondary | ICD-10-CM | POA: Insufficient documentation

## 2012-01-24 MED ORDER — HYDROCORTISONE 2.5 % RE CREA: TOPICAL_CREAM | Freq: Two times a day (BID) | RECTAL | Status: AC

## 2012-01-24 NOTE — Assessment & Plan Note (Signed)
Definitely not internal Will change to anusol HC cream bid-tid Prune juice daily to keep bowels regulary

## 2012-01-24 NOTE — Patient Instructions (Signed)
Use the cream twice a day regularly till you don't feel the swelling of the hemorrhoid Please take prune juice regularly to keep bowels moving

## 2012-01-24 NOTE — Progress Notes (Signed)
Subjective:    Patient ID: Brett [name], male    DOB: 07/09/1934, 78 y.o.   MRN: [id number]  HPI Has had trouble with hemorrhoids in the past 2 weeks No trouble in several years before this  Bowels have been irregular May go 3 days without a stool----then hemorrhoid will come out Anusol suppositories have helped some Uses prune juice and raisins if no stool in 2 days  Only has had a couple of small spots of blood on toilet paper till last night (in shower) Nurse concerned he may need procedure to drain No sig pain or itching  Current Outpatient Prescriptions on File Prior to Visit  Medication Sig Dispense Refill  . aspirin 81 MG tablet Take 81 mg by mouth daily.        Jennette Banker Sodium 30-100 MG CAPS Take 1 tablet by mouth 2 (two) times daily.      . cetirizine (ZYRTEC) 10 MG tablet Take 10 mg by mouth daily.        . Cholecalciferol (VITAMIN D3) 1000 UNITS CAPS Take by mouth daily.        . colchicine 0.6 MG tablet Take 0.6 mg by mouth daily.        Marland Kitchen etodolac (LODINE) 500 MG tablet TAKE 1 TABLET DAILY  90 tablet  1  . furosemide (LASIX) 20 MG tablet Take 20 mg by mouth daily. Hold if home weight under 188#      . hydrocortisone (ANUSOL-HC) 25 MG suppository Place 1 suppository (25 mg total) rectally 2 (two) times daily as needed.  30 suppository  1  . metoprolol tartrate (LOPRESSOR) 25 MG tablet Take 1 tablet (25 mg total) by mouth 2 (two) times daily.  60 tablet  11  . Multiple Vitamin (MULTIVITAMIN) capsule Take 1 capsule by mouth daily.        . Multiple Vitamins-Minerals (OCUVITE PRESERVISION) TABS Take 1 tablet by mouth daily.        Marland Kitchen omeprazole (PRILOSEC) 20 MG capsule TAKE ONE CAPSULE BY MOUTH TWICE A DAY  60 capsule  12  . pravastatin (PRAVACHOL) 40 MG tablet Take 40 mg by mouth daily.      . predniSONE (DELTASONE) 10 MG tablet Take as directed per Dr Ulyses Jarred instructions  10 tablet  0  . predniSONE (DELTASONE) 20 MG tablet Take 30 mg by mouth daily.        . traMADol (ULTRAM) 50 MG tablet TAKE 1 TABLET (50 MG TOTAL) BY MOUTH EVERY 8 (EIGHT) HOURS AS NEEDED FOR PAIN.  90 tablet  0  . vitamin B-12 (CYANOCOBALAMIN) 1000 MCG tablet Take 1,000 mcg by mouth daily.        Marland Kitchen DISCONTD: famotidine (PEPCID) 20 MG tablet Take 20 mg by mouth at bedtime.          Allergies  Allergen Reactions  . Penicillins   . Sulfonamide Derivatives     REACTION: itching    Past Medical History  Diagnosis Date  . Allergy   . Hx of colonic polyps   . Diverticulitis   . Gout   . Hypertension   . Arthritis   . Hyperlipidemia   . GERD (gastroesophageal reflux disease)   . Pulmonary fibrosis   . Aortic stenosis   . Carotid stenosis   . Cataract     Past Surgical History  Procedure Date  . Anomalous pulmonary venous return repair   . Cardiac catheterization   . Melanoma excision     shoulder  Family History  Problem Relation Age of Onset  . Diabetes Mother   . Cancer Sister     breast  . Diabetes Maternal Grandmother     History   Social History  . Marital Status: Married    Spouse Name: N/A    Number of Children: 1  . Years of Education: N/A   Occupational History  . retired Proofreader    Social History Main Topics  . Smoking status: Former Smoker -- 1.0 packs/day for 5 years    Types: Cigarettes    Quit date: 10/19/1963  . Smokeless tobacco: Never Used   Comment: quit in the 60's  . Alcohol Use: Yes     rarely  . Drug Use: No  . Sexually Active: Not on file   Other Topics Concern  . Not on file   Social History Narrative   Retired Engineer, site.  No mold in home, no cedar in or around home.  No exposure to chemicals, dusts, asbestos, pesticides.  Smoked very briefly (<4 years) in teenage years.     Review of Systems Appetite is okay Weight stable Weaning off the prednisone    Objective:   Physical Exam  Constitutional: He appears well-developed and well-nourished. No distress.  Genitourinary:        Slight blood staining Moderate sized external hemorrhoid with dark spot on top. No tenderness or particular hardness inside No internal hemorrhoids          Assessment & Plan:

## 2012-01-28 ENCOUNTER — Other Ambulatory Visit: Payer: Self-pay | Admitting: Internal Medicine

## 2012-01-31 ENCOUNTER — Ambulatory Visit (INDEPENDENT_AMBULATORY_CARE_PROVIDER_SITE_OTHER): Payer: Medicare Other | Admitting: Pulmonary Disease

## 2012-01-31 ENCOUNTER — Encounter: Payer: Self-pay | Admitting: Pulmonary Disease

## 2012-01-31 VITALS — BP 122/72 | HR 77 | Temp 97.7°F | Ht 72.0 in | Wt 197.8 lb

## 2012-01-31 DIAGNOSIS — J8410 Pulmonary fibrosis, unspecified: Secondary | ICD-10-CM

## 2012-01-31 DIAGNOSIS — I4891 Unspecified atrial fibrillation: Secondary | ICD-10-CM

## 2012-01-31 DIAGNOSIS — I359 Nonrheumatic aortic valve disorder, unspecified: Secondary | ICD-10-CM

## 2012-01-31 DIAGNOSIS — R269 Unspecified abnormalities of gait and mobility: Secondary | ICD-10-CM

## 2012-01-31 NOTE — Assessment & Plan Note (Signed)
This has been a stable interval for Brett [name], in fact he is doing somewhat better subjectively.  His resting oxygenation is better today than I have seen it lately, I think likely because he has diuresed more since our last visit.  He was started on steroids in the hospital, but I am not convinced that that has made any difference as he feels great today off of them (today was the last day of a slow taper).   Plan: -try to get an objective assessment of his improvement with a 6 minute walk and spirometry at [facility].    -decrease his oxygen use to 4 L per minute while at rest, 6 at exercise.  -we focused a lot on his fluid status today in counseling, I advised a low sodium diet and trying to keep his weight under 195 lbs with lasix. -he will resume lung works at [facility] (pulm rehab) in May -f/u in July after his visit at [facility] in June 2013

## 2012-01-31 NOTE — Assessment & Plan Note (Signed)
Obtain copy of recent Echo from his cardiologist. See above discussion of weight/diuretics, etc.

## 2012-01-31 NOTE — Patient Instructions (Signed)
We will get the records of your Echocardiogram from Dr. Philemon Kingdom office. Measure your weight every day.  If your weight goes over 195 lbs then you should take the furosemide once a day until your weight is under 195 lbs. You can decrease your resting oxygen to 4 L per minute; you need to turn it up to 6 L a minute when you walk. We will request that [facility] repeats a 6 minute walk when you see them in May. We will have you get simple spirometry at [facility] in May when you go back to [facility]. We will see you again in July after you see the physicians at [facility].

## 2012-01-31 NOTE — Progress Notes (Signed)
Subjective:    Patient ID: Brett [name], male    DOB: 12-31-1933, 78 y.o.   MRN: [id number]  Synopsis: Brett [name] is a very pleasant male with diffuse parenchymal lung disease who has been followed by [facility] since 2008.  At that time he was seen by Dr. [name] for shortness of breath and a dry cough.  He was seen by Dr. [name] who felt that his lung disease was due to chronic aspiration.  At that time a spartan serologic work up was negative.  Between 2008 and 2011 he was able to walk 3-4 miles a day. He went on oxygen in 2011.  In 2012 his exercise tolerance began to decline and his aspiration symptoms were worse.  He underwent an esophageal balloon dilation in 10/2011.  He was hospitalized for pneumonia in 12/2011.  HPI  09/29/11 2 week return visit:  He returns today for follow up on his diffuse parenchymal lung disease. He has undergone a barium swallow and a repeat CT scan, see below. He has had no change in his cough and shortness of breath. He is not exercising.  He also notes left lower quadrant cramping abdominal pain for one day. No fever, chills, nausea, vomiting, diarrhea. He says that this feels like his prior episodes of diverticulitis.  11/26/11 ROV -- Brett [name] returns for a follow up on his pulmonary fibrosis believed to be due to aspiration.  He underwent a balloon dilation of his esophagus this week after the esophogram showed a distal stricture.  I don't have the report from this yet. He is participating in pulmonary rehab and feels like it is going well.  He states that his breathing is about the same as when I saw him before.  He has lost a few pounds intentionally. His cough is at baseline.  He is planning a trip to [place] soon.  01/06/12 ROV-- Brett [name] was recently hosptialized after his trip to [place].  He developed a cough with sputum production and shortness of breath.  He was diagnosed with pneumonia, fluid overload, and apparently liver dysfunction (I do  not have a discharge summary).  Apparently he stayed in the ICU for several days on high flow oxygen and was treated with antibiotics, steroids, and diuretics  He was discharged with new prescriptions for Advair and Spiriva and 30mg  daily of prednisone.  His cough persists but he does not produce sputum.  He feels better but is clearly weaker than prior to his hospitalization.  He has started working in pulmonary rehab again at [facility].  His oxygen requirements are now higher at 6L/min  4/15 ROV -- Brett [name] is feeling great and states that his breathing is doing OK. Since his last visit with [place] he has tapered the prednisone, in fact he stopped prednisone today.  He has no cough and states that his exercise tolerance has improved since hospital discharge.  He stopped diuresis regularly on 3/28 when his weight was 188 lbs at [name] office.  His weight has drifted up somewhat since then (he says his daily bathroom weight is ~193lbs.)   He is in good spirits.  He needs to have a skin cancer removed from his left ear.  It has been a few weeks since he has been in pulmonary rehab.  Past Medical History  Diagnosis Date  . Allergy   . Hx of colonic polyps   . Diverticulitis   . Gout   . Hypertension   . Arthritis   .  Hyperlipidemia   . GERD (gastroesophageal reflux disease)   . Pulmonary fibrosis   . Aortic stenosis   . Carotid stenosis   . Cataract      Review of Systems  Constitutional: Positive for fatigue. Negative for fever, chills and unexpected weight change.  Respiratory: Positive for shortness of breath. Negative for cough, choking, chest tightness and wheezing.   Cardiovascular: Negative for chest pain, palpitations and leg swelling.    Objective:   Physical Exam   There were no vitals filed for this visit.  Gen: chronically ill, overweight white male HEENT: NCAT, PERRL, EOMi, OP clear, neck supple without masses PULM: Insp crackles, wheezes noted throughout but improved on  today's exam CV: RRR, no mgr, no JVD Ext: warm, no edema, notable for clubbing, no cyanosis Neuro: A&Ox4, MAEW   Full PFT's from 07/2010: FEV1 1.84, (63%), Raio 86%, TLC 7.07 (105%), DLCO 41%  CT Thorax from 09/2011 showed progressive fibrosis compared to the 2008 study.   Radiology felt that he had traction bronchiectasis and honeycombing in the bases and periphery which radiology felt was due to UIP.  I think that he has clear bronchiectasis and interstitial thickening but the honeycombing is less discreet.  I am uncertain if this represents a UIP pattern.     Assessment & Plan:   PULMONARY FIBROSIS This has been a stable interval for Brett [name], in fact he is doing somewhat better subjectively.  His resting oxygenation is better today than I have seen it lately, I think likely because he has diuresed more since our last visit.  He was started on steroids in the hospital, but I am not convinced that that has made any difference as he feels great today off of them (today was the last day of a slow taper).   Plan: -try to get an objective assessment of his improvement with a 6 minute walk and spirometry at [facility].    -decrease his oxygen use to 4 L per minute while at rest, 6 at exercise.  -we focused a lot on his fluid status today in counseling, I advised a low sodium diet and trying to keep his weight under 195 lbs with lasix. -he will resume lung works at [employer] (pulm rehab) in May -f/u in July after his visit at [facility] in June 2013  AORTIC STENOSIS Obtain copy of recent Echo from his cardiologist. See above discussion of weight/diuretics, etc.     Updated Medication List Outpatient Encounter Prescriptions as of 01/31/2012  Medication Sig Dispense Refill  . aspirin 81 MG tablet Take 81 mg by mouth daily.        Jennette Banker Sodium 30-100 MG CAPS Take 1 tablet by mouth 2 (two) times daily.      . cetirizine (ZYRTEC) 10 MG tablet Take 10 mg by mouth daily.        .  Cholecalciferol (VITAMIN D3) 1000 UNITS CAPS Take by mouth daily.        . colchicine 0.6 MG tablet Take 0.6 mg by mouth daily.        Marland Kitchen etodolac (LODINE) 500 MG tablet TAKE 1 TABLET DAILY  90 tablet  1  . furosemide (LASIX) 20 MG tablet Take 20 mg by mouth daily. Hold if home weight under 188#      . hydrocortisone (ANUSOL-HC) 2.5 % rectal cream Place rectally 2 (two) times daily.  30 g  2  . metoprolol tartrate (LOPRESSOR) 25 MG tablet Take 1 tablet (25 mg total) by mouth  2 (two) times daily.  60 tablet  11  . Multiple Vitamin (MULTIVITAMIN) capsule Take 1 capsule by mouth daily.        . Multiple Vitamins-Minerals (OCUVITE PRESERVISION) TABS Take 1 tablet by mouth daily.        Marland Kitchen omeprazole (PRILOSEC) 20 MG capsule TAKE ONE CAPSULE BY MOUTH TWICE A DAY  60 capsule  11  . pravastatin (PRAVACHOL) 40 MG tablet Take 40 mg by mouth daily.      . predniSONE (DELTASONE) 10 MG tablet Take as directed per Dr Ulyses Jarred instructions  10 tablet  0  . predniSONE (DELTASONE) 20 MG tablet Take 30 mg by mouth daily.      . traMADol (ULTRAM) 50 MG tablet TAKE 1 TABLET (50 MG TOTAL) BY MOUTH EVERY 8 (EIGHT) HOURS AS NEEDED FOR PAIN.  90 tablet  0  . vitamin B-12 (CYANOCOBALAMIN) 1000 MCG tablet Take 1,000 mcg by mouth daily.

## 2012-02-02 ENCOUNTER — Other Ambulatory Visit: Payer: Self-pay | Admitting: Internal Medicine

## 2012-02-02 ENCOUNTER — Telehealth: Payer: Self-pay | Admitting: Pulmonary Disease

## 2012-02-02 NOTE — Telephone Encounter (Signed)
Pt advised of instructions for spirometry. Carron Curie, CMA

## 2012-02-03 ENCOUNTER — Ambulatory Visit: Payer: Medicare Other | Admitting: Pulmonary Disease

## 2012-02-05 ENCOUNTER — Other Ambulatory Visit: Payer: Self-pay | Admitting: Internal Medicine

## 2012-02-11 ENCOUNTER — Other Ambulatory Visit: Payer: Self-pay | Admitting: Internal Medicine

## 2012-02-11 ENCOUNTER — Encounter: Payer: Self-pay | Admitting: Adult Health

## 2012-02-11 ENCOUNTER — Telehealth: Payer: Self-pay | Admitting: Pulmonary Disease

## 2012-02-11 ENCOUNTER — Ambulatory Visit (INDEPENDENT_AMBULATORY_CARE_PROVIDER_SITE_OTHER): Payer: Medicare Other | Admitting: Adult Health

## 2012-02-11 VITALS — BP 104/62 | HR 76 | Temp 97.0°F | Ht 72.0 in | Wt 196.8 lb

## 2012-02-11 DIAGNOSIS — J8410 Pulmonary fibrosis, unspecified: Secondary | ICD-10-CM

## 2012-02-11 MED ORDER — LEVOFLOXACIN 500 MG PO TABS: 500.0000 mg | ORAL_TABLET | Freq: Every day | ORAL | Status: AC

## 2012-02-11 MED ORDER — PREDNISONE 10 MG PO TABS: ORAL_TABLET | ORAL | Status: DC

## 2012-02-11 NOTE — Telephone Encounter (Signed)
Okay #90 x 1 

## 2012-02-11 NOTE — Patient Instructions (Addendum)
Prednisone taper over next week.  Symbicort 160 /4.51mcg 2 puffs Twice daily  Until sample is gone.  Mucinex DM .Twice daily  As needed  Cough/cough congestion  If your cough worsen with discolored mucus can begin Levaquin 500mg  daily x 7 days  follow up Dr. Kendrick Fries 2-3 weeks at [facility] office .  Please contact office for sooner follow up if symptoms do not improve or worsen or seek emergency care

## 2012-02-11 NOTE — Telephone Encounter (Signed)
Rx sent electronically.  

## 2012-02-11 NOTE — Progress Notes (Signed)
Subjective:    Patient ID: Brett [name], male    DOB: Jan 11, 1934, 78 y.o.   MRN: [id number]  Synopsis: Brett [name] is a very pleasant male with diffuse parenchymal lung disease who has been followed by [facility] since 2008.  At that time he was seen by Dr. [name] for shortness of breath and a dry cough.  He was seen by Dr. [name] who felt that his lung disease was due to chronic aspiration.  At that time a spartan serologic work up was negative.  Between 2008 and 2011 he was able to walk 3-4 miles a day. He went on oxygen in 2011.  In 2012 his exercise tolerance began to decline and his aspiration symptoms were worse.  He underwent an esophageal balloon dilation in 10/2011.  He was hospitalized for pneumonia in 12/2011.  HPI  09/29/11 2 week return visit:  He returns today for follow up on his diffuse parenchymal lung disease. He has undergone a barium swallow and a repeat CT scan, see below. He has had no change in his cough and shortness of breath. He is not exercising.  He also notes left lower quadrant cramping abdominal pain for one day. No fever, chills, nausea, vomiting, diarrhea. He says that this feels like his prior episodes of diverticulitis.  11/26/11 ROV -- Mr. [name] returns for a follow up on his pulmonary fibrosis believed to be due to aspiration.  He underwent a balloon dilation of his esophagus this week after the esophogram showed a distal stricture.  I don't have the report from this yet. He is participating in pulmonary rehab and feels like it is going well.  He states that his breathing is about the same as when I saw him before.  He has lost a few pounds intentionally. His cough is at baseline.  He is planning a trip to [place] soon.  01/06/12 ROV-- Mr. [name] was recently hosptialized after his trip to [place].  He developed a cough with sputum production and shortness of breath.  He was diagnosed with pneumonia, fluid overload, and apparently liver dysfunction (I do  not have a discharge summary).  Apparently he stayed in the ICU for several days on high flow oxygen and was treated with antibiotics, steroids, and diuretics  He was discharged with new prescriptions for Advair and Spiriva and 30mg  daily of prednisone.  His cough persists but he does not produce sputum.  He feels better but is clearly weaker than prior to his hospitalization.  He has started working in pulmonary rehab again at [facility].  His oxygen requirements are now higher at 6L/min  4/15 ROV -- [name] is feeling great and states that his breathing is doing OK. Since his last visit with [place] he has tapered the prednisone, in fact he stopped prednisone today.  He has no cough and states that his exercise tolerance has improved since hospital discharge.  He stopped diuresis regularly on 3/28 when his weight was 188 lbs at [name] office.  His weight has drifted up somewhat since then (he says his daily bathroom weight is ~193lbs.)   He is in good spirits.  He needs to have a skin cancer removed from his left ear.  It has been a few weeks since he has been in pulmonary rehab.  02/11/2012 Acute OV  Complains of increased SOB, wheezing x3days, prod cough with clear mucus this AM.   Home weight is 188 today . No fever or discolored mucus.  No hemoptysis, abdominal pain,  leg swelling, nausea, vomiting. Says he is sleeping well, without any, orthopnea, or PND.     Past Medical History  Diagnosis Date  . Allergy   . Hx of colonic polyps   . Diverticulitis   . Gout   . Hypertension   . Arthritis   . Hyperlipidemia   . GERD (gastroesophageal reflux disease)   . Pulmonary fibrosis   . Aortic stenosis   . Carotid stenosis   . Cataract      Review of Systems  Constitutional:   No  weight loss, night sweats,  Fevers, chills,  +fatigue, or  lassitude.  HEENT:   No headaches,  Difficulty swallowing,  Tooth/dental problems, or  Sore throat,                No sneezing, itching, ear ache, nasal  congestion, post nasal drip,   CV:  No chest pain,  Orthopnea, PND, swelling in lower extremities, anasarca, dizziness, palpitations, syncope.   GI  No heartburn, indigestion, abdominal pain, nausea, vomiting, diarrhea, change in bowel habits, loss of appetite, bloody stools.   Resp:    No coughing up of blood.    No chest wall deformity  Skin: no rash or lesions.  GU: no dysuria, change in color of urine, no urgency or frequency.  No flank pain, no hematuria   MS:  No joint pain or swelling.  No decreased range of motion.  No back pain.  Psych:  No change in mood or affect. No depression or anxiety.  No memory loss.       Objective:   Physical Exam    GEN: A/Ox3; pleasant , NAD chronically ill appearing in wheelchair   HEENT:  [place]/AT,  EACs-clear, TMs-wnl, NOSE-clear, THROAT-clear, no lesions, no postnasal drip or exudate noted.   NECK:  Supple w/ fair ROM; no JVD; normal carotid impulses w/o bruits; no thyromegaly or nodules palpated; no lymphadenopathy.  RESP  Coarse BS w/ bibasilar crackles no accessory muscle use, no dullness to percussion  CARD:  RRR, no m/r/g  , no peripheral edema, pulses intact, no cyanosis or clubbing.  GI:   Soft & nt; nml bowel sounds; no organomegaly or masses detected.  Musco: Warm bil, no deformities or joint swelling noted.   Neuro: alert, no focal deficits noted.    Skin: Warm, no lesions or rashes    Full PFT's from 07/2010: FEV1 1.84, (63%), Raio 86%, TLC 7.07 (105%), DLCO 41%  CT Thorax from 09/2011 showed progressive fibrosis compared to the 2008 study.   Radiology felt that he had traction bronchiectasis and honeycombing in the bases and periphery which radiology felt was due to UIP.  I think that he has clear bronchiectasis and interstitial thickening but the honeycombing is less discreet.  I am uncertain if this represents a UIP pattern.     Assessment & Plan:

## 2012-02-11 NOTE — Telephone Encounter (Signed)
Spoke with pt and he states having increased SOB today, only with exertion. OV with TP at 4:00 pm today.

## 2012-02-11 NOTE — Assessment & Plan Note (Signed)
Flare with ?URI/bronchitis   Plan:  Prednisone taper over next week.  Symbicort 160 /4.20mcg 2 puffs Twice daily  Until sample is gone.  Mucinex DM .Twice daily  As needed  Cough/cough congestion  If your cough worsen with discolored mucus can begin Levaquin 500mg  daily x 7 days  follow up Dr. Kendrick Fries 2-3 weeks at [facility] office .  Please contact office for sooner follow up if symptoms do not improve or worsen or seek emergency care

## 2012-02-14 ENCOUNTER — Ambulatory Visit: Payer: Medicare Other | Admitting: Internal Medicine

## 2012-02-16 ENCOUNTER — Ambulatory Visit: Payer: Self-pay | Admitting: Internal Medicine

## 2012-02-16 ENCOUNTER — Encounter: Payer: Self-pay | Admitting: Internal Medicine

## 2012-02-17 ENCOUNTER — Telehealth: Payer: Self-pay | Admitting: Pulmonary Disease

## 2012-02-17 NOTE — Telephone Encounter (Signed)
I spoke with pt and is calling to give TP an update on how he is doing. He states the prednisone has helped with his breathing and is beginning to feel better. He is going to have a growth removed off of his ear on Monday and i wished him luck with everything. i advised TP was not in the rest of the week but will forward to her as an FYI. Nothing further was needed.

## 2012-02-24 ENCOUNTER — Telehealth: Payer: Self-pay | Admitting: Internal Medicine

## 2012-02-24 NOTE — Telephone Encounter (Signed)
Has noticed some ankle edema Breathing is a little worse Advised to restart the furosemide Change the lower limit for Rx to 185#

## 2012-02-24 NOTE — Telephone Encounter (Signed)
Caller: Gates/Patient; PCP: Tillman Abide I.; CB#: [phone]; ; ; Call regarding Edema; onset 02/23/12.  States had carcinoma removed 02/21/12 but no IV at that time.  States bilateral feet are swollen.  BP 88/66, wt 190.7, down over past 3 days from 193.  Has not been taking furosemide x 2 weeks.  States has mild SOB when he moves.  Pulse ox is 91 on 6L/min.  States his pulse ox was down in the 80's during PM 02/23/12.  States pulminologist wants to keep his weight under 200 lb, but Dr. Alphonsus Sias wants lasix if > 188 lbs.  Subtle changes noted in condition; per nursing judgment, info to Dr. Alphonsus Sias high priority for reivew/callback.  MAY REACH PATIENT AT [phone].

## 2012-02-26 ENCOUNTER — Other Ambulatory Visit: Payer: Self-pay | Admitting: Internal Medicine

## 2012-02-28 ENCOUNTER — Inpatient Hospital Stay: Payer: Self-pay | Admitting: Internal Medicine

## 2012-02-28 LAB — CBC WITH DIFFERENTIAL/PLATELET
Basophil %: 0.7 %
Eosinophil #: 0.2 10*3/uL (ref 0.0–0.7)
Eosinophil %: 2 %
HCT: 34.5 % — ABNORMAL LOW (ref 40.0–52.0)
HGB: 11.1 g/dL — ABNORMAL LOW (ref 13.0–18.0)
Lymphocyte #: 1.2 10*3/uL (ref 1.0–3.6)
Lymphocyte %: 15 %
MCH: 28.4 pg (ref 26.0–34.0)
MCHC: 32.3 g/dL (ref 32.0–36.0)
MCV: 88 fL (ref 80–100)
Monocyte %: 4.9 %
Neutrophil #: 5.9 10*3/uL (ref 1.4–6.5)
Platelet: 252 10*3/uL (ref 150–440)
RDW: 18.6 % — ABNORMAL HIGH (ref 11.5–14.5)
WBC: 7.7 10*3/uL (ref 3.8–10.6)

## 2012-02-28 LAB — COMPREHENSIVE METABOLIC PANEL
Anion Gap: 7 (ref 7–16)
BUN: 30 mg/dL — ABNORMAL HIGH (ref 7–18)
Calcium, Total: 8.4 mg/dL — ABNORMAL LOW (ref 8.5–10.1)
Chloride: 101 mmol/L (ref 98–107)
Creatinine: 1.08 mg/dL (ref 0.60–1.30)
EGFR (African American): >60
Glucose: 125 mg/dL — ABNORMAL HIGH (ref 65–99)
SGOT(AST): 35 U/L (ref 15–37)
SGPT (ALT): 28 U/L
Total Protein: 8.1 g/dL (ref 6.4–8.2)

## 2012-02-28 LAB — TROPONIN I
Troponin-I: 0.03 ng/mL
Troponin-I: 0.05 ng/mL

## 2012-02-28 LAB — URINALYSIS, COMPLETE
Bacteria: NONE SEEN
Blood: NEGATIVE
Glucose,UR: NEGATIVE mg/dL (ref 0–75)
Ketone: NEGATIVE
Leukocyte Esterase: NEGATIVE
Ph: 5 (ref 4.5–8.0)
WBC UR: <1 /HPF (ref 0–5)

## 2012-02-29 LAB — CBC WITH DIFFERENTIAL/PLATELET
Eosinophil #: 0.2 10*3/uL (ref 0.0–0.7)
Eosinophil %: 3 %
HCT: 32.5 % — ABNORMAL LOW (ref 40.0–52.0)
HGB: 10.6 g/dL — ABNORMAL LOW (ref 13.0–18.0)
Lymphocyte #: 1.4 10*3/uL (ref 1.0–3.6)
MCH: 28.6 pg (ref 26.0–34.0)
MCHC: 32.6 g/dL (ref 32.0–36.0)
MCV: 88 fL (ref 80–100)
Monocyte %: 6 %
Neutrophil %: 71.2 %
Platelet: 228 10*3/uL (ref 150–440)
WBC: 7.1 10*3/uL (ref 3.8–10.6)

## 2012-02-29 LAB — BASIC METABOLIC PANEL
Anion Gap: 6 — ABNORMAL LOW (ref 7–16)
BUN: 26 mg/dL — ABNORMAL HIGH (ref 7–18)
Calcium, Total: 8.1 mg/dL — ABNORMAL LOW (ref 8.5–10.1)
Chloride: 100 mmol/L (ref 98–107)
Co2: 36 mmol/L — ABNORMAL HIGH (ref 21–32)
Creatinine: 1.08 mg/dL (ref 0.60–1.30)
EGFR (Non-African Amer.): >60
Potassium: 3.6 mmol/L (ref 3.5–5.1)
Sodium: 142 mmol/L (ref 136–145)

## 2012-02-29 LAB — TROPONIN I: Troponin-I: 0.04 ng/mL

## 2012-03-01 ENCOUNTER — Telehealth: Payer: Self-pay | Admitting: Pulmonary Disease

## 2012-03-01 NOTE — Telephone Encounter (Signed)
I spoke with pt and was calling to let Dr. Kendrick [name] know he is currently admitted at [facility]. Per pt for swelling in feet, increase SOB, and was dx w/ pulm HTN. Also he states he though he was scheduled for a "spirometry test" tomorrow and will not make it. Pt is scheduled for PFT at [facility] [phone]. The dept is closed so will call in the am

## 2012-03-02 NOTE — Telephone Encounter (Signed)
Called and spoke with scheduler at [facility] and had the PFT cancelled. Nothing further needed.

## 2012-03-03 LAB — BASIC METABOLIC PANEL
Anion Gap: 7 (ref 7–16)
BUN: 18 mg/dL (ref 7–18)
Chloride: 96 mmol/L — ABNORMAL LOW (ref 98–107)
Co2: 38 mmol/L — ABNORMAL HIGH (ref 21–32)
Creatinine: 0.7 mg/dL (ref 0.60–1.30)
EGFR (African American): >60
EGFR (Non-African Amer.): >60
Glucose: 92 mg/dL (ref 65–99)
Potassium: 3.2 mmol/L — ABNORMAL LOW (ref 3.5–5.1)

## 2012-03-07 ENCOUNTER — Telehealth: Payer: Self-pay | Admitting: *Deleted

## 2012-03-07 ENCOUNTER — Telehealth: Payer: Self-pay | Admitting: Internal Medicine

## 2012-03-07 ENCOUNTER — Other Ambulatory Visit: Payer: Self-pay | Admitting: *Deleted

## 2012-03-07 NOTE — Telephone Encounter (Signed)
[name] pharmacist from [facility]  calling asking for [place] to do prior auth for SILDENAFIL 20mg  ( Viagra?), 1 tab tid for pulmonary hypertension. Pt was at [facility] and when discharged was given this rx by Dr.Chen? And pt's insurance will not cover and [facility] wants [place] to do prior auth, family told pharmacist that if pt didn't get this medication he would be back in the hospital. [facility] tried to contact Dr.Chen with no luck to ask if this could be changed? This medication is not on med list , I did med reconciliation from [facility] and this wasn't on the list. [facility] will fax [facility] form, should I send this to pulmonary? Pt's daughter called Dr. Kendrick Fries to advise that pt was in the hospital.

## 2012-03-07 NOTE — Telephone Encounter (Signed)
Patient was discharged from [facility] and want PT to evaluate and treat, need verbal order.

## 2012-03-08 ENCOUNTER — Telehealth: Payer: Self-pay | Admitting: Internal Medicine

## 2012-03-08 NOTE — Telephone Encounter (Signed)
okay

## 2012-03-08 NOTE — Telephone Encounter (Signed)
Spoke with patient and now is O2 is up to 93% on 6 liters of oxygen. Pt states he was up to 96% earlier this week still on 6 liters of oxygen. Pt states that [facility] noticed the difference and wanted him seen, per pt he agrees with Dr.Letvak and will call if oxygen gets lower than that.

## 2012-03-08 NOTE — Telephone Encounter (Signed)
It was prescribed at [facility] and is indicated for pulmonary hypertension Please send this request to Dr Kendrick Fries as he will be better able to do the prior authorization (fax him copy of form if there is one and send this note)

## 2012-03-08 NOTE — Telephone Encounter (Signed)
I spoke with the pulmonologist who prescribed this while Neyland was in the hospital.  We can approve it but we also need records from the [facility] hospitalization including the most recent echocardiogram and [name] notes.  Sildenafil is Revatio when used for PH, not Viagra.

## 2012-03-08 NOTE — Telephone Encounter (Signed)
Please check with him to see if he feels worse There is nothing to change just because his oxygen is at 90% unless he feels like something has changed I actually would be very surprised if his oxygen levels were at 97% on a regular basis (or even at all) If he wants to be seen, put him in at 12:45

## 2012-03-08 NOTE — Telephone Encounter (Signed)
Form and phone note forwarded to Dr. Kendrick Fries

## 2012-03-08 NOTE — Telephone Encounter (Signed)
Spoke with Debra and advised results. 

## 2012-03-08 NOTE — Telephone Encounter (Signed)
Jasmine December called and asked if patient can be seen today.  Patient's a little more short of breath and his oxygen is 90% and it had been 97%.  Dr.McQuaid is at the hospital today.  Jasmine December was trying to get him in with you instead of sending him to the emergency room.

## 2012-03-08 NOTE — Telephone Encounter (Signed)
Okay to proceed with PT 

## 2012-03-09 NOTE — Telephone Encounter (Signed)
I called and spoke with pharmacist, Zella Ball, at [facility] and notified that med approved.   Called pt and notified med approved. Pt states that he feels that this is really going to help, he can tell a difference already in his breathing. Pt states that he has appt with Dr Park Breed to followup from recent hospital d/c. He states that this is supposed to be a "one time only visit". He would like to see Dr. Kendrick Fries in early June. Nothing is available. Is it okay to overbook the schedule to see him? He also wonders if he will still need to see Dr Jon Billings at [facility]. He fears getting too many different docs will confuse things. Please advise, thanks!

## 2012-03-09 NOTE — Telephone Encounter (Signed)
I have sent over form to [facility] to get records sent over. Will await for records to come through to initiate the PA

## 2012-03-09 NOTE — Telephone Encounter (Signed)
Yes, he can be overbooked to see me in June.  Seeing Park Breed is up to him, but I would prefer that he see Brett [name] at [facility] over [employer].  The reason for this is that he Brett [name]) is an expert in pulmonary fibrosis and that is ultimately Ibrahim's problem.

## 2012-03-09 NOTE — Telephone Encounter (Signed)
Called Dr Philemon Kingdom office and spoke with Hilda Lias and requested most recent [facility] await fax  Received PA form from [facility] [address]- called [phone] to initiate PA for sildenafil 20 mg tid  Was able to get med approved from 02-08-12 until 03-09-13. Was advised to wait 1 hr before letting pt and pharmacy know so this will give time for approval to go through computer system. Will hold in triage and call after 3:30.

## 2012-03-09 NOTE — Telephone Encounter (Signed)
Torrie will call on 5/24 to set up an appointment with me in June.  It is OK to over book him (ideally add as a 15 minute visit between two follow up visits) but whatever day he chooses I'm OK with as long as I'm in the office.

## 2012-03-10 ENCOUNTER — Telehealth: Payer: Self-pay | Admitting: Pulmonary Disease

## 2012-03-10 ENCOUNTER — Encounter: Payer: Self-pay | Admitting: Pulmonary Disease

## 2012-03-10 NOTE — Telephone Encounter (Signed)
I spoke with pt and scheduled appt with Dr Kendrick Fries for 03-20-12 at 4:30.

## 2012-03-17 ENCOUNTER — Encounter: Payer: Self-pay | Admitting: Internal Medicine

## 2012-03-17 ENCOUNTER — Ambulatory Visit (INDEPENDENT_AMBULATORY_CARE_PROVIDER_SITE_OTHER): Payer: Medicare Other | Admitting: Internal Medicine

## 2012-03-17 VITALS — BP 122/68 | HR 92 | Temp 98.4°F | Ht 72.0 in | Wt 192.0 lb

## 2012-03-17 DIAGNOSIS — I10 Essential (primary) hypertension: Secondary | ICD-10-CM

## 2012-03-17 DIAGNOSIS — K219 Gastro-esophageal reflux disease without esophagitis: Secondary | ICD-10-CM

## 2012-03-17 DIAGNOSIS — I2789 Other specified pulmonary heart diseases: Secondary | ICD-10-CM

## 2012-03-17 DIAGNOSIS — I272 Pulmonary hypertension, unspecified: Secondary | ICD-10-CM

## 2012-03-17 NOTE — Assessment & Plan Note (Signed)
Controlled with the med Will continue PPI in case it affects pulmonary status

## 2012-03-17 NOTE — Assessment & Plan Note (Signed)
BP Readings from Last 3 Encounters:  03/17/12 122/68  02/11/12 104/62  01/31/12 122/72   Off med due to the sildenafil Will keep off for now

## 2012-03-17 NOTE — Progress Notes (Signed)
Subjective:    Patient ID: Brett [name], male    DOB: 19-Jul-1934, 78 y.o.   MRN: [id number]  HPI Admitted with respiratory failure again Felt to be related mostly to the pulmonary hypertension He feels the new med (sildenafil) may be "kicking in" Breathing is better  Has been trying to do laps around house and is able to increase his walking speed Oximetry decreased into low 80's with exertion, worsens at first in rest, then improves Oxygen still at 6 liters per minute  No dizziness No syncope No chest pain other  Still on the furosemide daily Still some swelling in feet--- R>L but much better  Stomach quiet on prilosec  Current Outpatient Prescriptions on File Prior to Visit  Medication Sig Dispense Refill  . aspirin 81 MG tablet Take 81 mg by mouth daily.        . cetirizine (ZYRTEC) 10 MG tablet Take 10 mg by mouth at bedtime.       . Cholecalciferol (VITAMIN D3) 1000 UNITS CAPS Take by mouth daily.        . colchicine 0.6 MG tablet TAKE 1 TABLET ONCE DAILY TO PREVENT GOUT  90 tablet  2  . etodolac (LODINE) 500 MG tablet TAKE 1 TABLET DAILY  90 tablet  1  . furosemide (LASIX) 20 MG tablet TAKE 1 TABLET EVERY DAY  30 tablet  11  . Multiple Vitamin (MULTIVITAMIN) capsule Take 1 capsule by mouth daily.        . Multiple Vitamins-Minerals (OCUVITE PRESERVISION) TABS Take 1 tablet by mouth daily.        Marland Kitchen omeprazole (PRILOSEC) 20 MG capsule TAKE ONE CAPSULE BY MOUTH TWICE A DAY  60 capsule  11  . pravastatin (PRAVACHOL) 40 MG tablet TAKE 1 TABLET DAILY  90 tablet  0  . traMADol (ULTRAM) 50 MG tablet TAKE 1 TABLET (50 MG TOTAL) BY MOUTH EVERY 8 (EIGHT) HOURS AS NEEDED FOR PAIN.  90 tablet  1  . vitamin B-12 (CYANOCOBALAMIN) 1000 MCG tablet Take 1,000 mcg by mouth daily.        Marland Kitchen DISCONTD: famotidine (PEPCID) 20 MG tablet Take 20 mg by mouth at bedtime.          Allergies  Allergen Reactions  . Penicillins   . Sulfonamide Derivatives     REACTION: itching    Past Medical  History  Diagnosis Date  . Allergy   . Hx of colonic polyps   . Diverticulitis   . Gout   . Hypertension   . Arthritis   . Hyperlipidemia   . GERD (gastroesophageal reflux disease)   . Pulmonary fibrosis   . Aortic stenosis   . Carotid stenosis   . Cataract     Past Surgical History  Procedure Date  . Anomalous pulmonary venous return repair   . Cardiac catheterization   . Melanoma excision     shoulder    Family History  Problem Relation Age of Onset  . Diabetes Mother   . Cancer Sister     breast  . Diabetes Maternal Grandmother     History   Social History  . Marital Status: Married    Spouse Name: N/A    Number of Children: 1  . Years of Education: N/A   Occupational History  . retired Proofreader    Social History Main Topics  . Smoking status: Former Smoker -- 1.0 packs/day for 5 years    Types: Cigarettes    Quit date: 10/19/1963  .  Smokeless tobacco: Never Used   Comment: quit in the 60's  . Alcohol Use: Yes     rarely  . Drug Use: No  . Sexually Active: Not on file   Other Topics Concern  . Not on file   Social History Narrative   Retired Engineer, site.  No mold in home, no cedar in or around home.  No exposure to chemicals, dusts, asbestos, pesticides.  Smoked very briefly (<4 years) in teenage years.       Review of Systems Appetite is better Weight is down 4# here but his home fasting weight has gone up    Objective:   Physical Exam  Constitutional: He appears well-developed and well-nourished. No distress.       comfortable on oxygen  Neck: Normal range of motion. Neck supple.  Cardiovascular: Normal rate and regular rhythm.   No murmur heard.      ?fixed split S2 with prominent P2  Pulmonary/Chest: Effort normal. No respiratory distress. He has no wheezes. He has rales.       Diffuse coarse crackles up halfway Good air movement  Abdominal: Soft. There is no tenderness.  Musculoskeletal: He exhibits no edema  and no tenderness.  Lymphadenopathy:    He has no cervical adenopathy.  Psychiatric: He has a normal mood and affect. His behavior is normal.          Assessment & Plan:

## 2012-03-17 NOTE — Assessment & Plan Note (Signed)
Seems to be the primary reason for his recurrent respiratory failure Now on sildenafil---may have had some response Following with Dr Kendrick Fries

## 2012-03-18 ENCOUNTER — Ambulatory Visit: Payer: Self-pay | Admitting: Internal Medicine

## 2012-03-20 ENCOUNTER — Ambulatory Visit (INDEPENDENT_AMBULATORY_CARE_PROVIDER_SITE_OTHER): Payer: Medicare Other | Admitting: Pulmonary Disease

## 2012-03-20 ENCOUNTER — Encounter: Payer: Self-pay | Admitting: Pulmonary Disease

## 2012-03-20 ENCOUNTER — Ambulatory Visit: Payer: Medicare Other | Admitting: Internal Medicine

## 2012-03-20 VITALS — BP 106/62 | HR 94 | Temp 97.6°F | Ht 72.0 in | Wt 193.0 lb

## 2012-03-20 DIAGNOSIS — J8410 Pulmonary fibrosis, unspecified: Secondary | ICD-10-CM

## 2012-03-20 DIAGNOSIS — I272 Pulmonary hypertension, unspecified: Secondary | ICD-10-CM

## 2012-03-20 DIAGNOSIS — I2789 Other specified pulmonary heart diseases: Secondary | ICD-10-CM

## 2012-03-20 NOTE — Progress Notes (Signed)
Subjective:    Patient ID: Brett [name], male    DOB: [birth year], 78 y.o.   MRN: [id number]  Synopsis: Brett [name] is a very pleasant male with diffuse parenchymal lung disease who has been followed by [facility] since 2008.  At that time he was seen by Dr. [name] for shortness of breath and a dry cough.  He was seen by Dr. [name] who felt that his lung disease was due to chronic aspiration.  At that time a spartan serologic work up was negative.  Between 2008 and 2011 he was able to walk 3-4 miles a day. He went on oxygen in 2011.  In 2012 his exercise tolerance began to decline and his aspiration symptoms were worse.  He underwent an esophageal balloon dilation in 10/2011.  He was hospitalized for pneumonia and volume overload in March 2013. He was diuresed, given antibiotics and treated with prednisone for several weeks.  After he came off the prednisone he developed bronchitis and was hospitalized again in May with volume overload.  He was treated with diuresis and was started on Revatio for pulmonary hypertension.  Mar 07 2012 6 MW 790 feet, O2 saturation 95% at exercise on 55% VM.  In summary, he has been in a rapid state of decline since mid 2012 and it is unclear if prednisone was helpful.  HPI  09/29/11 2 week return visit:  He returns today for follow up on his diffuse parenchymal lung disease. He has undergone a barium swallow and a repeat CT scan, see below. He has had no change in his cough and shortness of breath. He is not exercising.  He also notes left lower quadrant cramping abdominal pain for one day. No fever, chills, nausea, vomiting, diarrhea. He says that this feels like his prior episodes of diverticulitis.  11/26/11 ROV -- Brett [name] returns for a follow up on his pulmonary fibrosis believed to be due to aspiration.  He underwent a balloon dilation of his esophagus this week after the esophogram showed a distal stricture.  I don't have the report from this yet. He is  participating in pulmonary rehab and feels like it is going well.  He states that his breathing is about the same as when I saw him before.  He has lost a few pounds intentionally. His cough is at baseline.  He is planning a trip to [place] soon.  01/06/12 ROV-- Brett [name] was recently hosptialized after his trip to [place].  He developed a cough with sputum production and shortness of breath.  He was diagnosed with pneumonia, fluid overload, and apparently liver dysfunction (I do not have a discharge summary).  Apparently he stayed in the ICU for several days on high flow oxygen and was treated with antibiotics, steroids, and diuretics  He was discharged with new prescriptions for Advair and Spiriva and 30mg  daily of prednisone.  His cough persists but he does not produce sputum.  He feels better but is clearly weaker than prior to his hospitalization.  He has started working in pulmonary rehab again at [facility].  His oxygen requirements are now higher at 6L/min  4/15 ROV -- Brett [name] is feeling great and states that his breathing is doing OK. Since his last visit with [place] he has tapered the prednisone, in fact he stopped prednisone today.  He has no cough and states that his exercise tolerance has improved since hospital discharge.  He stopped diuresis regularly on 3/28 when his weight was 188 lbs at [name]  office.  His weight has drifted up somewhat since then (he says his daily bathroom weight is ~193lbs.)   He is in good spirits.  He needs to have a skin cancer removed from his left ear.  It has been a few weeks since he has been in pulmonary rehab.  03/20/2012 ROV -- Brett [name] feels that he has been doing well since his last hospitalization and starting on the revatio. His cough is doing well, his swelling is at baseline and his weight is stable. He is planning to follow up with [name] office next week. He thinks that his exercise tolerance is improving as he notes that his oxygenation has been  "bouncing up above 90" faster recently.  He has not had fevers, chills, or chest pain.      Past Medical History  Diagnosis Date  . Allergy   . Hx of colonic polyps   . Diverticulitis   . Gout   . Hypertension   . Arthritis   . Hyperlipidemia   . GERD (gastroesophageal reflux disease)   . Pulmonary fibrosis   . Aortic stenosis   . Carotid stenosis   . Cataract      Review of Systems  Constitutional: Positive for fatigue. Negative for fever, chills and unexpected weight change.  Respiratory: Positive for cough and shortness of breath. Negative for choking, chest tightness and wheezing.   Cardiovascular: Positive for leg swelling. Negative for chest pain and palpitations.    Objective:   Physical Exam   Filed Vitals:   03/20/12 1650  BP: 106/62  Pulse: 94  Temp: 97.6 F (36.4 C)  TempSrc: Oral  Height: 6' (1.829 m)  Weight: 193 lb (87.544 kg)  SpO2: 96%    Gen: chronically ill, overweight white male HEENT: NCAT, PERRL, EOMi, OP clear, neck supple without masses PULM: Insp crackles throughout, worse in bases CV: RRR, no mgr, no JVD Ext: warm, trace pitting edema under compression stockings, notable for clubbing, no cyanosis Neuro: A&Ox4, MAEW   Full PFT's from 07/2010: FEV1 1.84, (63%), Raio 86%, TLC 7.07 (105%), DLCO 41%  CT Thorax from 09/2011 showed progressive fibrosis compared to the 2008 study.   Radiology felt that he had traction bronchiectasis and honeycombing in the bases and periphery which radiology felt was due to UIP.  I think that he has clear bronchiectasis and interstitial thickening but the honeycombing is less discreet.  I am uncertain if this represents a UIP pattern.  Mar 07 2012 6 MW 790 feet, O2 saturation 95% at exercise on 55% VM    Assessment & Plan:   PULMONARY FIBROSIS Unfortunately Brett [name] has had a rough go of it since our last visit.  He was hospitalized for hypoxemic respiratory failure and was treated with diuresis and revatio.  I  explained to Brett [name] today that I believe that the RVSP of 98 is reflective of his progressive ILD and while I don't disagree with the addition of the Revatio, it is doubtful that this will lead to an improved long term outcome.  The last six months have shown a rapid decline in Brett [name]'s condition.  I believe that in part his hospitalizations have been due to diastolic dysfunction and perhaps a contribution of PH.  However based on the progression in his baseline hypoxemia and imaging findings it is clear to me that his fibrosis has progressed. We discussed hospice but he continues to state that he wants to live as long as possible.  To that end, I would  like to have a second opinion from Brett [name] as to whether or not there is a treatment option here.  I have explained to Brett [name] that lung transplant is out of the question based on his comorbid illnesses.  Pulmonary hypertension I believe that his PH is due to his progressive pulmonary fibrosis.  Given his symptomatic improvement on Revatio we will continue this for now.  We could push the dose to 40mg  tid but given that he does not have primary PAH and his blood pressure is already low, I am reluctant to push it up any further today.     Updated Medication List Outpatient Encounter Prescriptions as of 03/20/2012  Medication Sig Dispense Refill  . aspirin 81 MG tablet Take 81 mg by mouth daily.        . cetirizine (ZYRTEC) 10 MG tablet Take 10 mg by mouth at bedtime.       . Cholecalciferol (VITAMIN D3) 1000 UNITS CAPS Take by mouth daily.        . colchicine 0.6 MG tablet TAKE 1 TABLET ONCE DAILY TO PREVENT GOUT  90 tablet  2  . etodolac (LODINE) 500 MG tablet TAKE 1 TABLET DAILY  90 tablet  1  . furosemide (LASIX) 20 MG tablet TAKE 1 TABLET EVERY DAY  30 tablet  11  . Multiple Vitamin (MULTIVITAMIN) capsule Take 1 capsule by mouth daily.        . Multiple Vitamins-Minerals (OCUVITE PRESERVISION) TABS Take 1 tablet by mouth daily.        Marland Kitchen  omeprazole (PRILOSEC) 20 MG capsule TAKE ONE CAPSULE BY MOUTH TWICE A DAY  60 capsule  11  . pravastatin (PRAVACHOL) 40 MG tablet TAKE 1 TABLET DAILY  90 tablet  0  . sildenafil (REVATIO) 20 MG tablet Take 20 mg by mouth 3 (three) times daily.       . traMADol (ULTRAM) 50 MG tablet TAKE 1 TABLET (50 MG TOTAL) BY MOUTH EVERY 8 (EIGHT) HOURS AS NEEDED FOR PAIN.  90 tablet  1  . vitamin B-12 (CYANOCOBALAMIN) 1000 MCG tablet Take 1,000 mcg by mouth daily.

## 2012-03-20 NOTE — Assessment & Plan Note (Signed)
I believe that his PH is due to his progressive pulmonary fibrosis.  Given his symptomatic improvement on Revatio we will continue this for now.  We could push the dose to 40mg  tid but given that he does not have primary PAH and his blood pressure is already low, I am reluctant to push it up any further today.

## 2012-03-20 NOTE — Patient Instructions (Signed)
Continue taking your medications as prescribed. Continue using the oxygen as ordered. Continue with your plans to follow up with [facility] in July. We will see you back in 6-8 weeks after you see Dr. Jon Billings.

## 2012-03-20 NOTE — Assessment & Plan Note (Addendum)
Unfortunately Brett [name] has had a rough go of it since our last visit.  He was hospitalized for hypoxemic respiratory failure and was treated with diuresis and revatio.  I explained to Brett [name] today that I believe that the RVSP of 98 is reflective of his progressive ILD and while I don't disagree with the addition of the Revatio, it is doubtful that this will lead to an improved long term outcome.  The last six months have shown a rapid decline in Brett [name]'s condition.  I believe that in part his hospitalizations have been due to diastolic dysfunction and perhaps a contribution of PH.  However based on the progression in his baseline hypoxemia and imaging findings it is clear to me that his fibrosis has progressed. We discussed hospice but he continues to state that he wants to live as long as possible.  To that end, I would like to have a second opinion from Dr. Jon Billings as to whether or not there is a treatment option here.  I have explained to Brett [name] that lung transplant is out of the question based on his comorbid illnesses.

## 2012-03-23 DIAGNOSIS — J9620 Acute and chronic respiratory failure, unspecified whether with hypoxia or hypercapnia: Secondary | ICD-10-CM

## 2012-03-23 DIAGNOSIS — J8410 Pulmonary fibrosis, unspecified: Secondary | ICD-10-CM

## 2012-03-23 DIAGNOSIS — I5033 Acute on chronic diastolic (congestive) heart failure: Secondary | ICD-10-CM

## 2012-03-31 ENCOUNTER — Other Ambulatory Visit: Payer: Self-pay | Admitting: Pulmonary Disease

## 2012-03-31 ENCOUNTER — Other Ambulatory Visit: Payer: Self-pay | Admitting: Internal Medicine

## 2012-03-31 MED ORDER — SILDENAFIL CITRATE 20 MG PO TABS: 20.0000 mg | ORAL_TABLET | Freq: Three times a day (TID) | ORAL | Status: DC

## 2012-03-31 NOTE — Telephone Encounter (Signed)
Will send to Dr. Kendrick Fries for refill Berkley Harvey.

## 2012-04-03 ENCOUNTER — Telehealth: Payer: Self-pay | Admitting: Pulmonary Disease

## 2012-04-03 NOTE — Telephone Encounter (Signed)
I reviewed his medications and allergies. I don't see any reason why he can't take the clarithromycin safely.

## 2012-04-03 NOTE — Telephone Encounter (Signed)
Pt returned call.  He has an appt with his dentist tomorrow for a routine cleaning but reports that his dentist will not do a cleaning without pt taking clarithromycin 500mg  2 tabs one hour prior to.  Pt is requesting to know if this is okay to take.  Dr Kendrick Fries is on night-float.  D/t to the time restraint, will forward to doc of the day.  Dr Delton Coombes please advise, thanks.  Allergies  Allergen Reactions  . Penicillins   . Sulfonamide Derivatives     REACTION: itching

## 2012-04-03 NOTE — Telephone Encounter (Signed)
LMTCB

## 2012-04-03 NOTE — Telephone Encounter (Signed)
Spoke with pt and notified ok to take the biaxin. He verbalized understanding and states nothing further needed.

## 2012-04-25 ENCOUNTER — Telehealth: Payer: Self-pay | Admitting: Pulmonary Disease

## 2012-04-25 DIAGNOSIS — J8410 Pulmonary fibrosis, unspecified: Secondary | ICD-10-CM

## 2012-04-25 NOTE — Telephone Encounter (Signed)
Per leslie okay to send order. Please advise PCC, thanks

## 2012-04-25 NOTE — Telephone Encounter (Signed)
Referral and ov note faxed to [facility]

## 2012-04-29 ENCOUNTER — Other Ambulatory Visit: Payer: Self-pay | Admitting: Internal Medicine

## 2012-05-01 ENCOUNTER — Telehealth: Payer: Self-pay | Admitting: Pulmonary Disease

## 2012-05-01 MED ORDER — SILDENAFIL CITRATE 20 MG PO TABS: 20.0000 mg | ORAL_TABLET | Freq: Three times a day (TID) | ORAL | Status: DC

## 2012-05-01 NOTE — Telephone Encounter (Signed)
Revatio rx was last sent to [facility] on 03/31/12 # 90 x 3.  I called [facility], spoke with Brett Canales who states they did not receive this rx.  Therefore, I gave VO for this -- Brett Canales verbalized understanding and voiced no further questions/concerns at this time.  I called, spoke with pt.  I apologized for any inconvenience and advised I have called this rx into [facility] as they did not receive it when sent in June.  He verbalized understanding of this and is aware to call back if he has any further questions.

## 2012-05-02 ENCOUNTER — Encounter: Payer: Self-pay | Admitting: Internal Medicine

## 2012-05-08 ENCOUNTER — Ambulatory Visit (INDEPENDENT_AMBULATORY_CARE_PROVIDER_SITE_OTHER): Payer: Medicare Other | Admitting: Pulmonary Disease

## 2012-05-08 ENCOUNTER — Encounter: Payer: Self-pay | Admitting: Pulmonary Disease

## 2012-05-08 VITALS — BP 124/64 | HR 82 | Temp 98.1°F | Ht 71.5 in | Wt 181.4 lb

## 2012-05-08 DIAGNOSIS — I272 Pulmonary hypertension, unspecified: Secondary | ICD-10-CM

## 2012-05-08 DIAGNOSIS — J8410 Pulmonary fibrosis, unspecified: Secondary | ICD-10-CM

## 2012-05-08 DIAGNOSIS — J9611 Chronic respiratory failure with hypoxia: Secondary | ICD-10-CM

## 2012-05-08 DIAGNOSIS — R634 Abnormal weight loss: Secondary | ICD-10-CM

## 2012-05-08 DIAGNOSIS — I2789 Other specified pulmonary heart diseases: Secondary | ICD-10-CM

## 2012-05-08 DIAGNOSIS — J9610 Chronic respiratory failure, unspecified whether with hypoxia or hypercapnia: Secondary | ICD-10-CM

## 2012-05-08 NOTE — Progress Notes (Signed)
Subjective:    Patient ID: Brett [name], male    DOB: 30-Sep-1934, 78 y.o.   MRN: [id number]  Synopsis: Brett [name] is a very pleasant male with diffuse parenchymal lung disease who has been followed by Brett [name] since 2008.  At that time he was seen by Dr. Sherene [name] for shortness of breath and a dry cough.  He was seen by Dr. Sherene [name] who felt that his lung disease was due to chronic aspiration.  At that time a spartan serologic work up was negative.  Between 2008 and 2011 he was able to walk 3-4 miles a day. He went on oxygen in 2011.  In 2012 his exercise tolerance began to decline and his aspiration symptoms were worse.  He underwent an esophageal balloon dilation in 10/2011.  He was hospitalized for pneumonia and volume overload in March 2013. He was diuresed, given antibiotics and treated with prednisone for several weeks.  After he came off the prednisone he developed bronchitis and was hospitalized again in May with volume overload.  He was treated with diuresis and was started on Revatio for [name] hypertension.  Mar 07 2012 6 MW 790 feet, O2 saturation 95% at exercise on 55% VM.  In summary, he has been in a rapid state of decline since mid 2012 and it is unclear if prednisone was helpful.  HPI  09/29/11 2 week return visit:  He returns today for follow up on his diffuse parenchymal lung disease. He has undergone a barium swallow and a repeat CT scan, see below. He has had no change in his cough and shortness of breath. He is not exercising.  He also notes left lower quadrant cramping abdominal pain for one day. No fever, chills, nausea, vomiting, diarrhea. He says that this feels like his prior episodes of diverticulitis.  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  He underwent a balloon dilation of his esophagus this week after the esophogram showed a distal stricture.  I don't have the report from this yet. He is  participating in [name] rehab and feels like it is going well.  He states that his breathing is about the same as when I saw him before.  He has lost a few pounds intentionally. His cough is at baseline.  He is planning a trip to [place] soon.  01/06/12 ROV-- Brett [name] was recently hosptialized after his trip to [place].  He developed a cough with sputum production and shortness of breath.  He was diagnosed with pneumonia, fluid overload, and apparently liver dysfunction (I do not have a discharge summary).  Apparently he stayed in the ICU for several days on high flow oxygen and was treated with antibiotics, steroids, and diuretics  He was discharged with new prescriptions for Advair and Spiriva and 30mg  daily of prednisone.  His cough persists but he does not produce sputum.  He feels better but is clearly weaker than prior to his hospitalization.  He has started working in [name] rehab again at [name].  His oxygen requirements are now higher at 6L/min  4/15 ROV -- Brett [name] is feeling great and states that his breathing is doing OK. Since his last visit with [place] he has tapered the prednisone, in fact he stopped prednisone today.  He has no cough and states that his exercise tolerance has improved since [name] discharge.  He stopped diuresis regularly on 3/28 when his weight was 188 lbs at Dr. Karle [name]  [name].  His weight has drifted up somewhat since then (he says his daily bathroom weight is ~193lbs.)   He is in good spirits.  He needs to have a skin cancer removed from his left ear.  It has been a few weeks since he has been in [name] rehab.  03/20/2012 ROV -- Brett [name] feels that he has been doing well since his last hospitalization and starting on the revatio. His cough is doing well, his swelling is at baseline and his weight is stable. He is planning to follow up with Brett [name] next week. He thinks that his exercise tolerance is improving as he notes that his oxygenation has been  "bouncing up above 90" faster recently.  He has not had fevers, chills, or chest pain.      05/08/2012 ROV --Since his last visit  Brett [name] has been the [name] interstitial lung disease clinic and saw Dr. Ileene [name] who feels that the pattern of his interstitial lung disease is most consistent with UIP. Surprisingly repeated lab work performed at [name] was markedly different than we had seen here at our center in that he had a very high ANA titer at 1: 2560. Based on this and his esophageal symptoms as well as the proximal muscle weakness Dr. Ileene [name] felt that it would be reasonable to start him on treatment for connective tissue disease related interstitial lung disease with prednisone and Imuran. Since that visit Brett [name] has had improved shortness of breath and no cough. He denies fevers chills or chest pain. His leg swelling has markedly improved and overall he feels well. He continues to take the prednisone Imuran and Bactrim as written by Dr. Ileene [name]. He is concerned about weight loss and states that his appetite has been poor. He is planning to start [name] rehabilitation again on Wednesday of this week.   Past Medical History  Diagnosis Date  . Allergy   . Hx of colonic polyps   . Diverticulitis   . Gout   . Hypertension   . Arthritis   . Hyperlipidemia   . GERD (gastroesophageal reflux disease)   . [name] fibrosis   . Aortic stenosis   . Carotid stenosis   . Cataract      Review of Systems  Constitutional: Positive for fatigue. Negative for fever, chills and unexpected weight change.  Respiratory: Positive for shortness of breath. Negative for cough, choking, chest tightness and wheezing.   Cardiovascular: Negative for chest pain, palpitations and leg swelling.    Objective:   Physical Exam   Filed Vitals:   05/08/12 1535  BP: 124/64  Pulse: 82  Temp: 98.1 F (36.7 C)  TempSrc: Oral  Height: 5' 11.5" (1.816 m)  Weight: 181 lb 6.4 oz (82.283 kg)  SpO2: 100%     Gen: chronically ill white male in good spirits and no acute distress HEENT: NCAT, PERRL, EOMi,  PULM: Insp crackles in bases only, otherwise clear to auscultation CV: RRR, no mgr, no JVD Ext: warm, no edema noted, notable for clubbing, no cyanosis Neuro: A&Ox4, MAEW   Full PFT's from 07/2010: FEV1 1.84, (63%), Raio 86%, TLC 7.07 (105%), DLCO 41%  CT Thorax from 09/2011 showed progressive fibrosis compared to the 2008 study.   Radiology felt that he had traction bronchiectasis and honeycombing in the bases and periphery which radiology felt was due to UIP.  I think that he has clear bronchiectasis and interstitial thickening but the honeycombing is less discreet.  I am uncertain if this represents  a UIP pattern.  Mar 07 2012 6 MW 790 feet, O2 saturation 95% at exercise on 55% VM    Assessment & Plan:   [name] FIBROSIS Likely UIP based on review of imaging by [name] and radiology specialists. I agree with the plan to treat with Imuran and prednisone and notes that Brett [name] looks better than I have seen him in the last year now he has been on this treatment plan. His weight loss is concerning but based on his physical exam I think that most of this was fluid weight. Given the fact that he feels so well and his oxygenation has improved I am not too concerned with the weight loss at this point.  Plan: -Continue prednisone and Imuran and Bactrim her [name] [place] recommendations. -Continue to monitor weight and call me if weight drops below 170 pounds. -We will check a chest x-ray, basic metabolic panel, and a repeat 6 minute walk this week to see how he was doing on therapy. -Continue [name] rehabilitation this week.  [name] hypertension Plan: -We will continue Revatio at 20 mg 3 times daily -Continue Lasix for now but we will adjust if needed based on his BMET obtained today -continue to monitor weight closely  Chronic  hypoxemic respiratory failure Continue 4L/min Continuous at rest, 6L/min continuous with exertion  Weight loss Since our last visit Brett [name] has lost 12 pounds. Based on his physical exam I think that most of this is fluid weight. Considering the fact that he feels so much better and his oxygenation is improved this doesn't concern me too much but when you keep an eye on it. Plan: -Check a basic metabolic panel today and adjust diuretics as needed -Encouraged intake of calorie rich foods while keeping sodium intake less than 2 g daily.    Updated Medication List Outpatient Encounter Prescriptions as of 05/08/2012  Medication Sig Dispense Refill  . aspirin 81 MG tablet Take 81 mg by mouth daily.        Marland Kitchen azaTHIOprine (IMURAN) 50 MG tablet Take 50 mg by mouth daily.      . cetirizine (ZYRTEC) 10 MG tablet Take 10 mg by mouth at bedtime.       . Cholecalciferol (VITAMIN D3) 1000 UNITS CAPS Take by mouth daily.        . colchicine 0.6 MG tablet TAKE 1 TABLET ONCE DAILY TO PREVENT GOUT  90 tablet  2  . etodolac (LODINE) 500 MG tablet TAKE 1 TABLET DAILY  90 tablet  1  . furosemide (LASIX) 20 MG tablet TAKE 1 TABLET EVERY DAY  30 tablet  11  . Multiple Vitamin (MULTIVITAMIN) capsule Take 1 capsule by mouth daily.        . Multiple Vitamins-Minerals (OCUVITE PRESERVISION) TABS Take 1 tablet by mouth daily.        Marland Kitchen omeprazole (PRILOSEC) 20 MG capsule TAKE ONE CAPSULE BY MOUTH TWICE A DAY  60 capsule  11  . pravastatin (PRAVACHOL) 40 MG tablet TAKE 1 TABLET DAILY  90 tablet  3  . predniSONE (DELTASONE) 10 MG tablet Take 10 mg by mouth daily.      . sildenafil (REVATIO) 20 MG tablet Take 1 tablet (20 mg total) by mouth 3 (three) times daily.  90 tablet  3  . sulfamethoxazole-trimethoprim (BACTRIM DS) 800-160 MG per tablet 1 tablet every Monday, Wednesday, and Friday      . traMADol (ULTRAM) 50 MG tablet TAKE 1 TABLET (50 MG  TOTAL) BY MOUTH EVERY 8 (EIGHT) HOURS AS NEEDED FOR PAIN.  90 tablet  1  .  vitamin B-12 (CYANOCOBALAMIN) 1000 MCG tablet Take 1,000 mcg by mouth daily.

## 2012-05-08 NOTE — Assessment & Plan Note (Signed)
Plan: -We will continue Revatio at 20 mg 3 times daily -Continue Lasix for now but we will adjust if needed based on his BMET obtained today -continue to monitor weight closely

## 2012-05-08 NOTE — Patient Instructions (Addendum)
Decrease your oxygen to 4L/min while at rest, use 6L/min while walking Continue to follow the plan of imuran and prednisone outlined by [name] Keep taking the bactrim as written Go back to pulmonary rehab on Wednesday as you are planning to do Eat calorie rich foods but keep sodium intake less than 2gm a day We will see you back in 2-3 months or sooner if needed.

## 2012-05-08 NOTE — Assessment & Plan Note (Addendum)
Likely UIP based on review of imaging by [facility] pulmonary and radiology specialists. I agree with the plan to treat with Imuran and prednisone and notes that Mr. Mayford Knife looks better than I have seen him in the last year now he has been on this treatment plan. His weight loss is concerning but based on his physical exam I think that most of this was fluid weight. Given the fact that he feels so well and his oxygenation has improved I am not too concerned with the weight loss at this point.  Plan: -Continue prednisone and Imuran and Bactrim her [facility] recommendations. -Continue to monitor weight and call me if weight drops below 170 pounds. -We will check a chest x-ray, basic metabolic panel, and a repeat 6 minute walk this week to see how he was doing on therapy. -Continue pulmonary rehabilitation this week.

## 2012-05-08 NOTE — Assessment & Plan Note (Signed)
Since our last visit Malakie has lost 12 pounds. Based on his physical exam I think that most of this is fluid weight. Considering the fact that he feels so much better and his oxygenation is improved this doesn't concern me too much but when you keep an eye on it. Plan: -Check a basic metabolic panel today and adjust diuretics as needed -Encouraged intake of calorie rich foods while keeping sodium intake less than 2 g daily.

## 2012-05-08 NOTE — Assessment & Plan Note (Signed)
Continue 4L/min Continuous at rest, 6L/min continuous with exertion

## 2012-05-09 ENCOUNTER — Ambulatory Visit (INDEPENDENT_AMBULATORY_CARE_PROVIDER_SITE_OTHER)
Admission: RE | Admit: 2012-05-09 | Discharge: 2012-05-09 | Disposition: A | Discharge status: Home / Self Care | Payer: Medicare Other | Source: Ambulatory Visit | Attending: Pulmonary Disease | Admitting: Pulmonary Disease

## 2012-05-09 DIAGNOSIS — J8410 Pulmonary fibrosis, unspecified: Secondary | ICD-10-CM

## 2012-05-09 LAB — BASIC METABOLIC PANEL
BUN: 21 mg/dL (ref 6–23)
CO2: 39 mEq/L — ABNORMAL HIGH (ref 19–32)
Chloride: 92 mEq/L — ABNORMAL LOW (ref 96–112)
Creat: 0.85 mg/dL (ref 0.50–1.35)

## 2012-05-09 LAB — CBC
Hemoglobin: 11.5 g/dL — ABNORMAL LOW (ref 13.0–17.0)
MCH: 27.1 pg (ref 26.0–34.0)
MCHC: 31.1 g/dL (ref 30.0–36.0)
Platelets: 184 10*3/uL (ref 150–400)

## 2012-05-10 ENCOUNTER — Telehealth: Payer: Self-pay | Admitting: Pulmonary Disease

## 2012-05-10 NOTE — Progress Notes (Signed)
Quick Note:  Called and spoke with patient and informed him of results per Dr. Kendrick Fries. Pt verbalized understanding, Patient had no further questions or concerns regarding this matter. ______

## 2012-05-10 NOTE — Telephone Encounter (Signed)
Please let him know that we need to know if his weight drops below 170lbs.

## 2012-05-10 NOTE — Telephone Encounter (Signed)
Returned patients call and informed him of CXR and lab results per Dr. Kendrick Fries. Patient wanted to let Dr. Kendrick Fries know that he has dropped "7/10ths in weight" and also wanted Dr.McQuaid to know, "he got insurance money taking care of."

## 2012-05-11 NOTE — Telephone Encounter (Signed)
Pt aware. Brett [name], CMA  

## 2012-05-19 ENCOUNTER — Encounter: Payer: Self-pay | Admitting: Internal Medicine

## 2012-05-19 ENCOUNTER — Ambulatory Visit (INDEPENDENT_AMBULATORY_CARE_PROVIDER_SITE_OTHER): Payer: Medicare Other | Admitting: Internal Medicine

## 2012-05-19 VITALS — BP 130/73 | HR 98 | Temp 97.7°F | Ht 71.5 in | Wt 172.2 lb

## 2012-05-19 DIAGNOSIS — F411 Generalized anxiety disorder: Secondary | ICD-10-CM

## 2012-05-19 DIAGNOSIS — F419 Anxiety disorder, unspecified: Secondary | ICD-10-CM

## 2012-05-19 MED ORDER — LORAZEPAM 0.5 MG PO TABS: 0.2500 mg | ORAL_TABLET | Freq: Three times a day (TID) | ORAL | Status: DC | PRN

## 2012-05-19 NOTE — Assessment & Plan Note (Signed)
Doesn't seem to have a true depressive disorder--mild symptoms that are reactive Does have anxiety which is common with dyspnea---will try low dose lorazepam prn His cognitive changes suggest he is not tolerating the lower oxygen---will have him go back to 6 l/min Will notify Dr Kendrick Fries

## 2012-05-19 NOTE — Patient Instructions (Addendum)
Please increase the oxygen back to 6 liters/minute

## 2012-05-19 NOTE — Progress Notes (Signed)
Subjective:    Patient ID: Brett [name], male    DOB: Jan 20, 1934, 78 y.o.   MRN: [id number]  HPI Has started azathioprine Recent reevaluation by Dr Kendrick Fries Cut the oxygen back to 4l/min---he then had trouble doing crosswords and computer games Trouble concentrating---can't make up his mind about what he is going to wear, etc Has feeling of "blue funk" Not feasible to increase to 6./min for exertion since the concentrator is often in a different room Does do bicycle on floor --can do for 25 minutes  Goes to [employer] and will walk some with wife Helps with light housework Hasn't restarted home treadmill  Monitoring weight daily Last 172.4# today Ongoing weight loss Has just low doses of ensure  Also has ongoing anxiety Gets dyspnea feeling but also just "blah" Feels like he wants to cry---did have 1 spell Some degree of anhedonia but still enjoys puzzles,etc---just harder to do them  Ongoing anxiety and nervous feeling Wife indicates this is the biggest issue  Current Outpatient Prescriptions on File Prior to Visit  Medication Sig Dispense Refill  . aspirin 81 MG tablet Take 81 mg by mouth daily.        Marland Kitchen azaTHIOprine (IMURAN) 50 MG tablet Take 50 mg by mouth daily.      . cetirizine (ZYRTEC) 10 MG tablet Take 10 mg by mouth at bedtime.       . Cholecalciferol (VITAMIN D3) 1000 UNITS CAPS Take by mouth daily.        . colchicine 0.6 MG tablet TAKE 1 TABLET ONCE DAILY TO PREVENT GOUT  90 tablet  2  . etodolac (LODINE) 500 MG tablet TAKE 1 TABLET DAILY  90 tablet  1  . furosemide (LASIX) 20 MG tablet TAKE 1 TABLET EVERY DAY  30 tablet  11  . Multiple Vitamin (MULTIVITAMIN) capsule Take 1 capsule by mouth daily.        . Multiple Vitamins-Minerals (OCUVITE PRESERVISION) TABS Take 1 tablet by mouth daily.        Marland Kitchen omeprazole (PRILOSEC) 20 MG capsule TAKE ONE CAPSULE BY MOUTH TWICE A DAY  60 capsule  11  . pravastatin (PRAVACHOL) 40 MG tablet TAKE 1 TABLET DAILY  90 tablet  3    . predniSONE (DELTASONE) 10 MG tablet Take 10 mg by mouth daily.      . sildenafil (REVATIO) 20 MG tablet Take 1 tablet (20 mg total) by mouth 3 (three) times daily.  90 tablet  3  . sulfamethoxazole-trimethoprim (BACTRIM DS) 800-160 MG per tablet 1 tablet every Monday, Wednesday, and Friday      . traMADol (ULTRAM) 50 MG tablet TAKE 1 TABLET (50 MG TOTAL) BY MOUTH EVERY 8 (EIGHT) HOURS AS NEEDED FOR PAIN.  90 tablet  1  . vitamin B-12 (CYANOCOBALAMIN) 1000 MCG tablet Take 1,000 mcg by mouth daily.        Marland Kitchen DISCONTD: famotidine (PEPCID) 20 MG tablet Take 20 mg by mouth at bedtime.          Allergies  Allergen Reactions  . Penicillins   . Sulfonamide Derivatives     REACTION: itching    Past Medical History  Diagnosis Date  . Allergy   . Hx of colonic polyps   . Diverticulitis   . Gout   . Hypertension   . Arthritis   . Hyperlipidemia   . GERD (gastroesophageal reflux disease)   . Pulmonary fibrosis   . Aortic stenosis   . Carotid stenosis   . Cataract  Past Surgical History  Procedure Date  . Anomalous pulmonary venous return repair   . Cardiac catheterization   . Melanoma excision     shoulder    Family History  Problem Relation Age of Onset  . Diabetes Mother   . Cancer Sister     breast  . Diabetes Maternal Grandmother     History   Social History  . Marital Status: Married    Spouse Name: N/A    Number of Children: 1  . Years of Education: N/A   Occupational History  . retired Proofreader    Social History Main Topics  . Smoking status: Former Smoker -- 1.0 packs/day for 5 years    Types: Cigarettes    Quit date: 10/19/1963  . Smokeless tobacco: Never Used   Comment: quit in the 60's  . Alcohol Use: Yes     rarely  . Drug Use: No  . Sexually Active: Not on file   Other Topics Concern  . Not on file   Social History Narrative   Retired Engineer, site.  No mold in home, no cedar in or around home.  No exposure to  chemicals, dusts, asbestos, pesticides.  Smoked very briefly (<4 years) in teenage years.     Review of Systems Sleeping around 6-7 hours Rarely can nap    Objective:   Physical Exam  Constitutional: He appears well-developed. No distress.       Clearly has a thinner face  Neck: Normal range of motion.  Cardiovascular: Normal rate, regular rhythm and normal heart sounds.  Exam reveals no gallop.   No murmur heard. Pulmonary/Chest: Effort normal and breath sounds normal. No respiratory distress. He has no wheezes. He has no rales.       Really no crackles today  Musculoskeletal: He exhibits no edema.  Lymphadenopathy:    He has no cervical adenopathy.          Assessment & Plan:

## 2012-05-22 ENCOUNTER — Telehealth: Payer: Self-pay

## 2012-05-22 ENCOUNTER — Telehealth: Payer: Self-pay | Admitting: Pulmonary Disease

## 2012-05-22 NOTE — Telephone Encounter (Signed)
He saw Dr. Alphonsus Sias for this last week, see clinic note. I'm not sure what else to tell him other than what you have done already.

## 2012-05-22 NOTE — Telephone Encounter (Signed)
I called [phone] which was all info left on v/m. Pt answered and could not understand what I was saying. Viviann Spare with [facility] pulmonary rehab program picked up phone; pt's memory is much worse and pt is very confused according to [place]. Pt was going to walk thru door with his O2 and could not decide how to get thru the door; Viviann Spare said all he needed to do was walk thru the door. Viviann Spare ran couple of strips on pt and showed multfocal pvc's. Viviann Spare wanted to know if he should contact cardiologist or have pt seen in our office this morning. Dee said Viviann Spare could contact cardiologist; if any problem reaching cardiologist Viviann Spare will call our office back.

## 2012-05-22 NOTE — Telephone Encounter (Signed)
Noted. Dee please follow up on this.

## 2012-05-22 NOTE — Telephone Encounter (Signed)
I spoke with pt he states he has been very confused often. He is not able to do cross word puzzles any longer, he sometimes can't figure out how to get his oxygen turned up or to turn this down, and he states he has been getting confused about everything. Pt states his weight is down to 171.4. He is having a lot of anxiety and Dr. Alphonsus Sias gave him ativan to help with this. On 05/19/12 his oxygen was increased to 6 liters. I advised pt he needed to call [name] office regarding his confusion and memory loss. He sttes he will do so and will forward to Dr. Kendrick Fries as an [place] Childes

## 2012-05-23 ENCOUNTER — Other Ambulatory Visit: Payer: Self-pay | Admitting: Internal Medicine

## 2012-05-23 NOTE — Telephone Encounter (Signed)
We never suggested that he see a neurologist.  I think Dr. Alphonsus Sias addressed the confusion in a recent clinic visit.  Perhaps seeing Alphonsus Sias again is reasonable.

## 2012-05-23 NOTE — Telephone Encounter (Signed)
Spoke with patient and scheduled appt Thursday at 4:30pm to see Dr.Letvak, advised pt if anything changes to give [place] a call back.

## 2012-05-23 NOTE — Telephone Encounter (Signed)
Spoke with patient and he states Dr.McQuaids office suggested he see a neurologist? I don't see that in the phone note from [facility] office, pt states he didn't see the cardiologist, he didn't know what I was talking about. Please advise, should I schedule appt with Dr.Letvak? Can he wait that long?

## 2012-05-23 NOTE — Telephone Encounter (Signed)
okay

## 2012-05-23 NOTE — Telephone Encounter (Signed)
It's hard for me to say since I have not evaluated him.  I am assuming that's ok to wait to schedule an appointment this week with Dr. Alphonsus Sias if there are no acute concerns.  I would schedule him to see one of [place] prior to Dr. Alphonsus Sias returning if you feel there is an acute issue.

## 2012-05-24 ENCOUNTER — Encounter: Payer: Self-pay | Admitting: Pulmonary Disease

## 2012-05-24 ENCOUNTER — Other Ambulatory Visit: Payer: Self-pay | Admitting: *Deleted

## 2012-05-24 MED ORDER — TRAMADOL HCL 50 MG PO TABS: 50.0000 mg | ORAL_TABLET | Freq: Three times a day (TID) | ORAL | Status: DC | PRN

## 2012-05-25 ENCOUNTER — Telehealth: Payer: Self-pay

## 2012-05-25 ENCOUNTER — Encounter: Payer: Self-pay | Admitting: Pulmonary Disease

## 2012-05-25 ENCOUNTER — Inpatient Hospital Stay (HOSPITAL_COMMUNITY)
Admission: AD | Admit: 2012-05-25 | Discharge: 2012-05-26 | DRG: 189 | Disposition: A | Discharge status: Home Health | Payer: Medicare Other | Source: Other Acute Inpatient Hospital | Attending: Pulmonary Disease | Admitting: Pulmonary Disease

## 2012-05-25 ENCOUNTER — Ambulatory Visit: Payer: Medicare Other | Admitting: Internal Medicine

## 2012-05-25 ENCOUNTER — Emergency Department: Payer: Self-pay | Admitting: Emergency Medicine

## 2012-05-25 DIAGNOSIS — R0989 Other specified symptoms and signs involving the circulatory and respiratory systems: Secondary | ICD-10-CM

## 2012-05-25 DIAGNOSIS — K219 Gastro-esophageal reflux disease without esophagitis: Secondary | ICD-10-CM | POA: Diagnosis present

## 2012-05-25 DIAGNOSIS — I6529 Occlusion and stenosis of unspecified carotid artery: Secondary | ICD-10-CM | POA: Diagnosis present

## 2012-05-25 DIAGNOSIS — I272 Pulmonary hypertension, unspecified: Secondary | ICD-10-CM | POA: Diagnosis present

## 2012-05-25 DIAGNOSIS — I2789 Other specified pulmonary heart diseases: Secondary | ICD-10-CM | POA: Diagnosis present

## 2012-05-25 DIAGNOSIS — Z880 Allergy status to penicillin: Secondary | ICD-10-CM

## 2012-05-25 DIAGNOSIS — J9610 Chronic respiratory failure, unspecified whether with hypoxia or hypercapnia: Principal | ICD-10-CM | POA: Diagnosis present

## 2012-05-25 DIAGNOSIS — I10 Essential (primary) hypertension: Secondary | ICD-10-CM | POA: Diagnosis present

## 2012-05-25 DIAGNOSIS — F411 Generalized anxiety disorder: Secondary | ICD-10-CM | POA: Diagnosis present

## 2012-05-25 DIAGNOSIS — Z9981 Dependence on supplemental oxygen: Secondary | ICD-10-CM

## 2012-05-25 DIAGNOSIS — F3289 Other specified depressive episodes: Secondary | ICD-10-CM | POA: Diagnosis present

## 2012-05-25 DIAGNOSIS — I359 Nonrheumatic aortic valve disorder, unspecified: Secondary | ICD-10-CM | POA: Diagnosis present

## 2012-05-25 DIAGNOSIS — Z882 Allergy status to sulfonamides status: Secondary | ICD-10-CM

## 2012-05-25 DIAGNOSIS — J8410 Pulmonary fibrosis, unspecified: Secondary | ICD-10-CM | POA: Diagnosis present

## 2012-05-25 DIAGNOSIS — Z79899 Other long term (current) drug therapy: Secondary | ICD-10-CM

## 2012-05-25 DIAGNOSIS — Z85820 Personal history of malignant melanoma of skin: Secondary | ICD-10-CM

## 2012-05-25 DIAGNOSIS — F419 Anxiety disorder, unspecified: Secondary | ICD-10-CM

## 2012-05-25 DIAGNOSIS — Z87891 Personal history of nicotine dependence: Secondary | ICD-10-CM

## 2012-05-25 DIAGNOSIS — R0902 Hypoxemia: Secondary | ICD-10-CM

## 2012-05-25 DIAGNOSIS — Z7982 Long term (current) use of aspirin: Secondary | ICD-10-CM

## 2012-05-25 DIAGNOSIS — R0600 Dyspnea, unspecified: Secondary | ICD-10-CM

## 2012-05-25 DIAGNOSIS — E785 Hyperlipidemia, unspecified: Secondary | ICD-10-CM | POA: Diagnosis present

## 2012-05-25 DIAGNOSIS — Z23 Encounter for immunization: Secondary | ICD-10-CM

## 2012-05-25 DIAGNOSIS — F329 Major depressive disorder, single episode, unspecified: Secondary | ICD-10-CM | POA: Diagnosis present

## 2012-05-25 DIAGNOSIS — IMO0002 Reserved for concepts with insufficient information to code with codable children: Secondary | ICD-10-CM

## 2012-05-25 HISTORY — DX: Shortness of breath: R06.02

## 2012-05-25 HISTORY — DX: Malignant (primary) neoplasm, unspecified: C80.1

## 2012-05-25 LAB — BASIC METABOLIC PANEL
BUN: 23 mg/dL — ABNORMAL HIGH (ref 7–18)
Chloride: 97 mmol/L — ABNORMAL LOW (ref 98–107)
Co2: 39 mmol/L — ABNORMAL HIGH (ref 21–32)
Creatinine: 0.8 mg/dL (ref 0.60–1.30)
Glucose: 102 mg/dL — ABNORMAL HIGH (ref 65–99)
Osmolality: 285 (ref 275–301)

## 2012-05-25 LAB — CBC WITH DIFFERENTIAL/PLATELET
Basophil #: 0 10*3/uL (ref 0.0–0.1)
Eosinophil #: 0 10*3/uL (ref 0.0–0.7)
Eosinophil %: 0.4 %
HCT: 36.6 % — ABNORMAL LOW (ref 40.0–52.0)
Lymphocyte #: 0.8 10*3/uL — ABNORMAL LOW (ref 1.0–3.6)
MCH: 28.9 pg (ref 26.0–34.0)
MCV: 87 fL (ref 80–100)
Monocyte #: 0.2 x10 3/mm (ref 0.2–1.0)
Neutrophil #: 4.5 10*3/uL (ref 1.4–6.5)
Platelet: 198 10*3/uL (ref 150–440)
RBC: 4.2 10*6/uL — ABNORMAL LOW (ref 4.40–5.90)

## 2012-05-25 LAB — URINALYSIS, COMPLETE
Bacteria: NONE SEEN
Bilirubin,UR: NEGATIVE
Blood: NEGATIVE
Ph: 7 (ref 4.5–8.0)
Protein: NEGATIVE
WBC UR: 1 /HPF (ref 0–5)

## 2012-05-25 MED ORDER — AZATHIOPRINE 50 MG PO TABS
50.0000 mg | ORAL_TABLET | Freq: Every day | ORAL | Status: DC
  Administered 2012-05-25 – 2012-05-26 (×2): 50 mg via ORAL
  Filled 2012-05-25 (×2): qty 1

## 2012-05-25 MED ORDER — ENOXAPARIN SODIUM 40 MG/0.4ML ~~LOC~~ SOLN
40.0000 mg | SUBCUTANEOUS | Status: DC
  Administered 2012-05-25: 40 mg via SUBCUTANEOUS
  Filled 2012-05-25 (×2): qty 0.4

## 2012-05-25 MED ORDER — ASPIRIN 81 MG PO TABS: 81.0000 mg | ORAL_TABLET | Freq: Every day | ORAL | Status: DC

## 2012-05-25 MED ORDER — ETODOLAC 300 MG PO CAPS
500.0000 mg | ORAL_CAPSULE | Freq: Every day | ORAL | Status: DC
  Administered 2012-05-26: 500 mg via ORAL
  Filled 2012-05-25 (×2): qty 1

## 2012-05-25 MED ORDER — SIMVASTATIN 20 MG PO TABS
20.0000 mg | ORAL_TABLET | Freq: Every day | ORAL | Status: DC
  Filled 2012-05-25: qty 1

## 2012-05-25 MED ORDER — PANTOPRAZOLE SODIUM 40 MG PO TBEC
40.0000 mg | DELAYED_RELEASE_TABLET | Freq: Every day | ORAL | Status: DC
  Administered 2012-05-26: 40 mg via ORAL
  Filled 2012-05-25: qty 1

## 2012-05-25 MED ORDER — COLCHICINE 0.6 MG PO TABS
0.6000 mg | ORAL_TABLET | Freq: Every day | ORAL | Status: DC
  Administered 2012-05-25 – 2012-05-26 (×2): 0.6 mg via ORAL
  Filled 2012-05-25 (×2): qty 1

## 2012-05-25 MED ORDER — METHYLPREDNISOLONE SODIUM SUCC 125 MG IJ SOLR
80.0000 mg | Freq: Two times a day (BID) | INTRAMUSCULAR | Status: DC
  Administered 2012-05-26: 80 mg via INTRAVENOUS
  Filled 2012-05-25 (×2): qty 1.28

## 2012-05-25 MED ORDER — LORAZEPAM 0.5 MG PO TABS: 0.2500 mg | ORAL_TABLET | Freq: Three times a day (TID) | ORAL | Status: DC | PRN

## 2012-05-25 MED ORDER — VITAMIN D3 25 MCG (1000 UT) PO CAPS
1000.0000 [IU] | ORAL_CAPSULE | Freq: Every day | ORAL | Status: DC
  Filled 2012-05-25 (×2): qty 1

## 2012-05-25 MED ORDER — ADULT MULTIVITAMIN W/MINERALS CH
1.0000 | ORAL_TABLET | Freq: Every day | ORAL | Status: DC
  Administered 2012-05-25 – 2012-05-26 (×2): 1 via ORAL
  Filled 2012-05-25 (×2): qty 1

## 2012-05-25 MED ORDER — ETODOLAC 500 MG PO TABS
500.0000 mg | ORAL_TABLET | Freq: Every day | ORAL | Status: DC
  Filled 2012-05-25: qty 1

## 2012-05-25 MED ORDER — SULFAMETHOXAZOLE-TMP DS 800-160 MG PO TABS
1.0000 | ORAL_TABLET | Freq: Every day | ORAL | Status: DC
  Administered 2012-05-25 – 2012-05-26 (×2): 1 via ORAL
  Filled 2012-05-25 (×2): qty 1

## 2012-05-25 MED ORDER — LEVOFLOXACIN IN D5W 750 MG/150ML IV SOLN
750.0000 mg | INTRAVENOUS | Status: DC
  Administered 2012-05-25: 750 mg via INTRAVENOUS
  Filled 2012-05-25 (×2): qty 150

## 2012-05-25 MED ORDER — FUROSEMIDE 20 MG PO TABS
20.0000 mg | ORAL_TABLET | Freq: Every day | ORAL | Status: DC
  Administered 2012-05-26: 20 mg via ORAL
  Filled 2012-05-25 (×2): qty 1

## 2012-05-25 MED ORDER — VITAMIN B-12 1000 MCG PO TABS
1000.0000 ug | ORAL_TABLET | Freq: Every day | ORAL | Status: DC
  Administered 2012-05-25 – 2012-05-26 (×2): 1000 ug via ORAL
  Filled 2012-05-25 (×3): qty 1

## 2012-05-25 MED ORDER — SILDENAFIL CITRATE 20 MG PO TABS
20.0000 mg | ORAL_TABLET | Freq: Three times a day (TID) | ORAL | Status: DC
  Administered 2012-05-25 – 2012-05-26 (×2): 20 mg via ORAL
  Filled 2012-05-25 (×4): qty 1

## 2012-05-25 MED ORDER — LORATADINE 10 MG PO TABS
10.0000 mg | ORAL_TABLET | Freq: Every day | ORAL | Status: DC
  Administered 2012-05-25 – 2012-05-26 (×2): 10 mg via ORAL
  Filled 2012-05-25 (×2): qty 1

## 2012-05-25 MED ORDER — ASPIRIN EC 81 MG PO TBEC
81.0000 mg | DELAYED_RELEASE_TABLET | Freq: Every day | ORAL | Status: DC
  Administered 2012-05-26: 81 mg via ORAL
  Filled 2012-05-25: qty 1

## 2012-05-25 MED ORDER — TRAMADOL HCL 50 MG PO TABS: 50.0000 mg | ORAL_TABLET | Freq: Three times a day (TID) | ORAL | Status: DC | PRN

## 2012-05-25 NOTE — Telephone Encounter (Signed)
Suzette pharmacist with [facility] request confirmation OK to fill Etodolac. When ran med contraindicated in pt's with CHF. Is OK to fill. Call Suzette [phone] ref # [id number].

## 2012-05-25 NOTE — Progress Notes (Signed)
  Subjective:    Patient ID: Brett [name], male    DOB: 1933-11-20, 78 y.o.   MRN: [id number]  HPI Brett [name] came to our clinic today unannounced. He actually came to the parking lot his car and had difficulty getting out. I would not to assess him and found his oxygen was working at 6 L continuously but his oxygen saturation was only 61% on that. He was cyanotic on exam. He states that he just been feeling confused lately and maybe a little bit more short of breath. He has not been swelling having chest pain cough or fevers. We put him on a nonrebreather mask and his oxygen saturation improved to 99%. We called for emergency medical services and they came and took him to [facility] for further evaluation. I called the emergency room physician and explain the situation and advised that he had chest imaging broad-spectrum antibiotics and Solu-Medrol. He'll need to be evaluated for pulmonary embolism. Advised [facility] that they admitted him either to [facility] or to [facility].   Review of Systems     Objective:   Physical Exam        Assessment & Plan:

## 2012-05-25 NOTE — H&P (Addendum)
Name: Brett [name] MRN: [id number] DOB: 02/02/1934    LOS: 0  Referring Provider:  Transfer for [facility] Reason for Referral:  Hypoxia  PULMONARY / CRITICAL CARE MEDICINE  HPI:  78 yo Dr. Ulyses Jarred patient with pulmonary fibrosis (home oxygen 6 L) and pulmonary hypertension (Revatio) who presented to clinic on 8/8 confused, with SpO2 of 61%.  Placed on NRB with improvement of SpO2 to 100%.  Reported some confusion and increased dyspnea.  Denied chest pain, pedal edema, cough or purulent sputum.  Sent to [facility] by EMS and transferred to [facility] for further evaluation.  Past Medical History  Diagnosis Date  . Allergy   . Hx of colonic polyps   . Diverticulitis   . Gout   . Hypertension   . Arthritis   . Hyperlipidemia   . GERD (gastroesophageal reflux disease)   . Pulmonary fibrosis   . Aortic stenosis   . Carotid stenosis   . Cataract   . Anxiety    Past Surgical History  Procedure Date  . Anomalous pulmonary venous return repair   . Cardiac catheterization   . Melanoma excision     shoulder   Prior to Admission medications   Medication Sig Start Date End Date Taking? Authorizing Provider  aspirin 81 MG tablet Take 81 mg by mouth daily.      Historical Provider, MD  azaTHIOprine (IMURAN) 50 MG tablet Take 50 mg by mouth daily.    Historical Provider, MD  cetirizine (ZYRTEC) 10 MG tablet Take 10 mg by mouth at bedtime.     Historical Provider, MD  Cholecalciferol (VITAMIN D3) 1000 UNITS CAPS Take by mouth daily.      Historical Provider, MD  colchicine 0.6 MG tablet TAKE 1 TABLET ONCE DAILY TO PREVENT GOUT 02/05/12   Karie Schwalbe, MD  etodolac (LODINE) 500 MG tablet TAKE 1 TABLET DAILY 05/23/12   Karie Schwalbe, MD  furosemide (LASIX) 20 MG tablet TAKE 1 TABLET EVERY DAY 02/26/12   Karie Schwalbe, MD  LORazepam (ATIVAN) 0.5 MG tablet Take 0.5-1 tablets (0.25-0.5 mg total) by mouth 3 (three) times daily as needed for anxiety. 05/19/12 05/29/12  Karie Schwalbe, MD  Multiple Vitamin (MULTIVITAMIN) capsule Take 1 capsule by mouth daily.      Historical Provider, MD  Multiple Vitamins-Minerals (OCUVITE PRESERVISION) TABS Take 1 tablet by mouth daily.      Historical Provider, MD  omeprazole (PRILOSEC) 20 MG capsule TAKE ONE CAPSULE BY MOUTH TWICE A DAY 01/28/12   Karie Schwalbe, MD  pravastatin (PRAVACHOL) 40 MG tablet TAKE 1 TABLET DAILY 04/29/12   Karie Schwalbe, MD  predniSONE (DELTASONE) 10 MG tablet Take 10 mg by mouth daily.    Historical Provider, MD  sildenafil (REVATIO) 20 MG tablet Take 1 tablet (20 mg total) by mouth 3 (three) times daily. 05/01/12   Lupita Leash, MD  sulfamethoxazole-trimethoprim (BACTRIM DS) 800-160 MG per tablet 1 tablet every Monday, Wednesday, and Friday    Historical Provider, MD  traMADol (ULTRAM) 50 MG tablet Take 1 tablet (50 mg total) by mouth every 8 (eight) hours as needed for pain. 05/24/12   Karie Schwalbe, MD  vitamin B-12 (CYANOCOBALAMIN) 1000 MCG tablet Take 1,000 mcg by mouth daily.      Historical Provider, MD   Allergies Allergies  Allergen Reactions  . Penicillins   . Sulfonamide Derivatives     REACTION: itching   Family History Family History  Problem Relation Age  of Onset  . Diabetes Mother   . Cancer Sister     breast  . Diabetes Maternal Grandmother    Social History  reports that he quit smoking about 48 years ago. His smoking use included Cigarettes. He has a 5 pack-year smoking history. He has never used smokeless tobacco. He reports that he drinks alcohol. He reports that he does not use illicit drugs.  Review Of Systems:  Intermittent confusion and increased dyspnea, otherwise 11 points negative.  Vital Signs: Temp:  [97.4 F (36.3 C)] 97.4 F (36.3 C) (08/08 2130) Pulse Rate:  [90] 90  (08/08 2130) BP: (118)/(52) 118/52 mmHg (08/08 2130) SpO2:  [92 %] 92 % (08/08 2130)  Physical Examination: General:  No acute distress Neuro:  Awake, alert, oriented HEENT:   PERRL Neck:  Supple, no JVD Cardiovascular:  RRR, no murmurs Lungs:  Bilateral air entry, few dry rales Abdomen:  Soft, non tender, bowel sounds present Musculoskeletal:  Moves all extremities, no edema Skin:  Intact  Labs (outside): ABG:  7.48/55/76/41/96 (on 44%) WBC 5.5 (N 76)  Hb 12.1, Plt 198 Na 141 K 4.5 CO2 39 BUN 23  Cr 0.8 Gl 102  CXR:  Chronic scarring, no overt infiltrates.  ASSESSMENT AND PLAN  Pulmonary fibrosis Pulmonary hypertension Chronic respiratory failure Hypoxic episode in excess of baseline  -->  Continue home Rx -->  Supplemental oxygen, goal SpO2 > 92 -->  Chest CT/angio r/o PE and evaluate parenchyma -->  TTE to evaluate PA pressure -->  Levaquin (empirically)  -->  Solu-Medrol (empirically) --> CBC, BMP AM -->  Heparin for DVT Px  Lonia Farber, M.D. Pulmonary and Critical Care Medicine [facility] Pager: [phone]  05/25/2012, 10:02 PM

## 2012-05-25 NOTE — Telephone Encounter (Signed)
I would hold the etodolac for now---especially since his status is tenuous Just readmitted to hospital

## 2012-05-26 ENCOUNTER — Encounter (HOSPITAL_COMMUNITY): Payer: Self-pay | Admitting: Radiology

## 2012-05-26 ENCOUNTER — Inpatient Hospital Stay (HOSPITAL_COMMUNITY): Payer: Medicare Other

## 2012-05-26 DIAGNOSIS — F411 Generalized anxiety disorder: Secondary | ICD-10-CM

## 2012-05-26 DIAGNOSIS — I359 Nonrheumatic aortic valve disorder, unspecified: Secondary | ICD-10-CM

## 2012-05-26 DIAGNOSIS — I369 Nonrheumatic tricuspid valve disorder, unspecified: Secondary | ICD-10-CM

## 2012-05-26 LAB — CBC
Hemoglobin: 11.8 g/dL — ABNORMAL LOW (ref 13.0–17.0)
MCH: 27.8 pg (ref 26.0–34.0)
MCHC: 31.6 g/dL (ref 30.0–36.0)
Platelets: 188 10*3/uL (ref 150–400)
RDW: 16.5 % — ABNORMAL HIGH (ref 11.5–15.5)

## 2012-05-26 LAB — BASIC METABOLIC PANEL
Calcium: 9.3 mg/dL (ref 8.4–10.5)
GFR calc Af Amer: >90 mL/min (ref >90)
GFR calc non Af Amer: >90 mL/min (ref >90)
Glucose, Bld: 105 mg/dL — ABNORMAL HIGH (ref 70–99)
Potassium: 4.1 mEq/L (ref 3.5–5.1)
Sodium: 141 mEq/L (ref 135–145)

## 2012-05-26 LAB — PHOSPHORUS: Phosphorus: 3.9 mg/dL (ref 2.3–4.6)

## 2012-05-26 MED ORDER — CITALOPRAM HYDROBROMIDE 10 MG PO TABS: 10.0000 mg | ORAL_TABLET | Freq: Every day | ORAL | Status: DC

## 2012-05-26 MED ORDER — IOHEXOL 350 MG/ML SOLN
100.0000 mL | Freq: Once | INTRAVENOUS | Status: AC | PRN
  Administered 2012-05-26: 100 mL via INTRAVENOUS

## 2012-05-26 MED ORDER — VITAMIN D3 25 MCG (1000 UNIT) PO TABS
1000.0000 [IU] | ORAL_TABLET | Freq: Every day | ORAL | Status: DC
  Administered 2012-05-26: 1000 [IU] via ORAL
  Filled 2012-05-26: qty 1

## 2012-05-26 MED ORDER — LEVOFLOXACIN 750 MG PO TABS: 750.0000 mg | ORAL_TABLET | Freq: Every day | ORAL | Status: AC

## 2012-05-26 MED ORDER — ALPRAZOLAM 0.5 MG PO TABS: 0.5000 mg | ORAL_TABLET | Freq: Three times a day (TID) | ORAL | Status: DC | PRN

## 2012-05-26 MED ORDER — PREDNISONE 20 MG PO TABS: ORAL_TABLET | ORAL | Status: DC

## 2012-05-26 NOTE — Care Management Note (Signed)
  Page 1 of 1   05/26/2012     11:53:38 AM   CARE MANAGEMENT NOTE 05/26/2012  Patient:  Brett [name], Brett [name]   Account Number:  [id number]  Date Initiated:  05/26/2012  Documentation initiated by:  Ronny Flurry  Subjective/Objective Assessment:   PULMONARY FIBROSIS   RESPIRATORY FAILURE, CHRONIC     Action/Plan:   Anticipated DC Date:  05/26/2012   Anticipated DC Plan:  HOME/SELF CARE         Choice offered to / List presented to:             Status of service:  Completed, signed off Medicare Important Message given?   (If response is "NO", the following Medicare IM given date fields will be blank) Date Medicare IM given:   Date Additional Medicare IM given:    Discharge Disposition:  HOME/SELF CARE  Per UR Regulation:  Reviewed for med. necessity/level of care/duration of stay  If discussed at Long Length of Stay Meetings, dates discussed:    Comments:  05-26-12 1151 Patient 's wife at bedside. Patient does need portable oxygen tank for transportation to home. Ordered portable oxygen tank through [facility].   Ronny Flurry RN BSN [phone]   05-26-12 Patient states he has home oxygen through [facility].  Patient being discharged today and his wife is bringing his portable oxygen tank with her when she comes to drive him home.  Ronny Flurry RN BSN [phone]

## 2012-05-26 NOTE — Discharge Summary (Signed)
I saw and examined Brett [name] today.  I think that most of this came from malfunction with his O2 tank, but it is clear to me that he has progression of his fibrosis.  There is no convincing evidence on exam, history, or imaging that this was an infectious or cardiac event.  Will increase prednisone for a few weeks then see him back in clinic.  Yolonda Kida PCCM Pager: [phone] Cell: [phone] If no response, call [phone]

## 2012-05-26 NOTE — Progress Notes (Signed)
LB PCCM  Mr. Pulley has advanced pulmonary fibrosis and was admitted yesterday for hypoxemic respiratory failure.  This morning he looks well on 5 L O2 and feels much better; in regards to his confusion he says that he has been very anxious and depressed at home, not so confused.  I think his oxygen tank may have run out yesterday, but he says that he really didn't feel better until he got the solumedrol.  Filed Vitals:   05/25/12 2130 05/26/12 0124 05/26/12 0530  BP: 118/52  115/69  Pulse: 90  85  Temp: 97.4 F (36.3 C)  97.6 F (36.4 C)  Resp:   20  Height:  5' 11.5" (1.816 m)   SpO2: 92%  95%   Gen: comfortable HEENT: NCAT, OP clear PULM: Insp crackles throughout bilat CV: RRR, mechanical valve AB: soft, nontender Ext: warm, no edema Neuro: A&Ox4, maew  CT angio chest: no PE, worsening consolidation which likely represents progression of fibrosis in bases bilaterally.  Impression: 1) Progressive pulmonary fibrosis 2) Depression 3) Anxiety 4) Hypoxemic respiratory failure  Plan: 1) Walk this morning to establish O2 needs 2) Increase prednisone to 40mg  daily for 2 weeks, then 30mg  daily for 2 weeks, then 20mg  daily for two weeks 3) Start Celexa 4) Start xanax 0.5mg  po q8h prn anxiety 5) f/u with me in the office in 2-4 weeks.  Yolonda Kida PCCM Pager: [phone] Cell: [phone] If no response, call [phone]

## 2012-05-26 NOTE — Discharge Summary (Signed)
Physician Discharge Summary  Patient ID: Brett [name] MRN: [id number] DOB/AGE: Oct 29, 1933 78 y.o.  Admit date: 05/25/2012 Discharge date: 05/26/2012    Discharge Diagnoses:  Active Problems:  PULMONARY FIBROSIS  RESPIRATORY FAILURE, CHRONIC  Pulmonary hypertension  Hypoxemia    Brief Summary: Brett [name] is a 78 y.o. y/o male with a PMH of pulmonary fibrosis (home oxygen 6 L) and pulmonary hypertension (Revatio) who presented to clinic on 8/8 confused, with SpO2 of 61%. Placed on NRB with improvement of SpO2 to 100%. Reported some confusion and increased dyspnea. Denied chest pain, pedal edema, cough or purulent sputum. Sent to [facility] by EMS and transferred to [facility] for further evaluation.  Appears that his O2 tank likely ran out/malfunctioned in addition to likely flare of advanced pulmonary fibrosis.  He improved quickly with O2 and solumedrol.                                                                      Hospital Summary by Discharge Diagnosis Impression:  1) Progressive pulmonary fibrosis  2) Depression  3) Anxiety  4) Hypoxemic respiratory failure   Plan:  Initially rx with IV solumedrol and IV abx.  Improved quickly.  Now nearing baseline.  Walk this morning to establish O2 needs --> walked on 6L [place] x 5 mins and sats remained >92%.  Will d/c on previous 6L Case management/ home health to ensure adequate O2 equipment at home  Increase prednisone to 40mg  daily for 2 weeks, then 30mg  daily for 2 weeks, then 20mg  daily for two weeks  Start Celexa  Start xanax 0.5mg  po q8h prn anxiety  outpt f/u in 3-4 weeks - see below    Significant Diagnostic Studies:  CTA chest 8/8>>No significant central or proximal hilar pulmonary embolus. Poor opacification of the lower lobe segmental branches to exclude small lower lobe emboli.  Ascending aortic ectasia measuring 5 cm in AP diameter. Previous aortic valve replacement without cardiomegaly. No pericardial or pleural  effusion Extensive pulmonary interstitial fibrosis with progressive dense basilar consolidation with central air bronchograms, superimposed basilar edema or pneumonia not excluded. 2D echo 8/9>>>   Filed Vitals:   05/25/12 2130 05/26/12 0124 05/26/12 0530  BP: 118/52  115/69  Pulse: 90  85  Temp: 97.4 F (36.3 C)  97.6 F (36.4 C)  Resp:   20  Height:  5' 11.5" (1.816 m)   SpO2: 92%  95%  on 5L [place]    Discharge Labs  BMET  Lab 05/26/12 0630  NA 141  K 4.1  CL 98  CO2 34*  GLUCOSE 105*  BUN 20  CREATININE 0.64  CALCIUM 9.3  MG 2.2  PHOS 3.9     CBC   Lab 05/26/12 0630  HGB 11.8*  HCT 37.3*  WBC 6.2  PLT 188     Discharge Orders    Future Appointments: Provider: Department: Dept Phone: Center:   06/23/2012 3:30 PM Lupita Leash, MD [facility] [phone] None   07/17/2012 3:45 PM Karie Schwalbe, MD [facility] [phone] [id number]     Follow-up Information    Follow up with Max Fickle, MD on 06/23/2012. (3:30pm )    Contact information:   [address] [place] [address] [place] [address]  [phone]           Medication List  As of 05/26/2012 10:13 AM   CONTINUE taking these medications         aspirin 81 MG tablet      azaTHIOprine 50 MG tablet   Commonly known as: IMURAN      cetirizine 10 MG tablet   Commonly known as: ZYRTEC      colchicine 0.6 MG tablet      etodolac 500 MG tablet   Commonly known as: LODINE      furosemide 20 MG tablet   Commonly known as: LASIX      multivitamin capsule      omeprazole 20 MG capsule   Commonly known as: PRILOSEC      pravastatin 40 MG tablet   Commonly known as: PRAVACHOL      sildenafil 20 MG tablet   Commonly known as: REVATIO   Take 1 tablet (20 mg total) by mouth 3 (three) times daily.      sulfamethoxazole-trimethoprim 800-160 MG per tablet   Commonly known as: BACTRIM DS      traMADol 50 MG tablet   Commonly known as: ULTRAM   Take 1  tablet (50 mg total) by mouth every 8 (eight) hours as needed for pain.      vitamin B-12 1000 MCG tablet   Commonly known as: CYANOCOBALAMIN      Vitamin D3 1000 UNITS Caps         STOP taking these medications         predniSONE 10 MG tablet             Disposition:  Home   Discharged Condition: Brett [name] has met maximum benefit of inpatient care and is medically stable and cleared for discharge.  Patient is pending follow up as above.      Time spent on disposition:  Greater than 35 minutes.   SignedDanford Bad, NP 05/26/2012  10:13 AM Pager: [phone] or [phone]  *Care during the described time interval was provided by me and/or other providers on the critical care team. I have reviewed this patient's available data, including medical history, events of note, physical examination and test results as part of my evaluation.

## 2012-05-26 NOTE — Progress Notes (Signed)
Patient discharged to home with wife.  O2 5L on for transport.  Discharge teaching completed including follow up care, medications and home O2 use.  Home health to follow up with patient after discharge.

## 2012-05-26 NOTE — Telephone Encounter (Signed)
Spoke with [facility] and cancelled the etodolac

## 2012-05-26 NOTE — Telephone Encounter (Signed)
[facility] left v/m re: Etodolac. Call [phone] ref # [phone].

## 2012-05-26 NOTE — Progress Notes (Signed)
  Echocardiogram 2D Echocardiogram has been performed.  Brett [name] 05/26/2012, 10:02 AM

## 2012-05-29 ENCOUNTER — Other Ambulatory Visit: Payer: Self-pay | Admitting: *Deleted

## 2012-05-29 ENCOUNTER — Inpatient Hospital Stay: Payer: Self-pay | Admitting: Internal Medicine

## 2012-05-29 ENCOUNTER — Telehealth: Payer: Self-pay | Admitting: Pulmonary Disease

## 2012-05-29 LAB — URINALYSIS, COMPLETE
Bacteria: NONE SEEN
Bilirubin,UR: NEGATIVE
Blood: NEGATIVE
Ketone: NEGATIVE
Leukocyte Esterase: NEGATIVE
Nitrite: NEGATIVE
Ph: 6 (ref 4.5–8.0)
Protein: NEGATIVE
Specific Gravity: 1.021 (ref 1.003–1.030)
Squamous Epithelial: NONE SEEN
WBC UR: 2 /HPF (ref 0–5)

## 2012-05-29 LAB — CBC
HCT: 34.8 % — ABNORMAL LOW (ref 40.0–52.0)
HGB: 11.4 g/dL — ABNORMAL LOW (ref 13.0–18.0)
MCV: 88 fL (ref 80–100)
RBC: 3.95 10*6/uL — ABNORMAL LOW (ref 4.40–5.90)
RDW: 17.3 % — ABNORMAL HIGH (ref 11.5–14.5)
WBC: 7.7 10*3/uL (ref 3.8–10.6)

## 2012-05-29 LAB — CK TOTAL AND CKMB (NOT AT ARMC): CK, Total: 24 U/L — ABNORMAL LOW (ref 35–232)

## 2012-05-29 LAB — COMPREHENSIVE METABOLIC PANEL
Albumin: 3 g/dL — ABNORMAL LOW (ref 3.4–5.0)
Alkaline Phosphatase: 49 U/L — ABNORMAL LOW (ref 50–136)
BUN: 20 mg/dL — ABNORMAL HIGH (ref 7–18)
Bilirubin,Total: 0.3 mg/dL (ref 0.2–1.0)
Chloride: 100 mmol/L (ref 98–107)
Creatinine: 0.89 mg/dL (ref 0.60–1.30)
EGFR (African American): >60
EGFR (Non-African Amer.): >60
Glucose: 198 mg/dL — ABNORMAL HIGH (ref 65–99)
Osmolality: 289 (ref 275–301)
Potassium: 3.5 mmol/L (ref 3.5–5.1)
SGOT(AST): 20 U/L (ref 15–37)
Sodium: 141 mmol/L (ref 136–145)
Total Protein: 7.6 g/dL (ref 6.4–8.2)

## 2012-05-29 LAB — TROPONIN I: Troponin-I: <0.02 ng/mL

## 2012-05-29 LAB — ETHANOL: Ethanol %: <0.003 % (ref 0.000–0.080)

## 2012-05-29 LAB — AMMONIA: Ammonia, Plasma: <25 mcmol/L (ref 11–32)

## 2012-05-29 LAB — SALICYLATE LEVEL: Salicylates, Serum: <1.7 mg/dL

## 2012-05-29 MED ORDER — TRAMADOL HCL 50 MG PO TABS: 50.0000 mg | ORAL_TABLET | Freq: Three times a day (TID) | ORAL | Status: AC | PRN

## 2012-05-29 NOTE — Telephone Encounter (Signed)
LM on machine informing pt's spouse that we did receive her message stating that pt was going to the ED and that this will be forwarded to BQ to make him aware.  Advised to call if anything is needed.    Will sign and forward to BQ as FYI.

## 2012-05-29 NOTE — Telephone Encounter (Signed)
noted 

## 2012-05-30 ENCOUNTER — Telehealth: Payer: Self-pay | Admitting: Internal Medicine

## 2012-05-30 ENCOUNTER — Telehealth: Payer: Self-pay | Admitting: Pulmonary Disease

## 2012-05-30 LAB — BASIC METABOLIC PANEL
Anion Gap: 3 — ABNORMAL LOW (ref 7–16)
BUN: 16 mg/dL (ref 7–18)
Calcium, Total: 8.3 mg/dL — ABNORMAL LOW (ref 8.5–10.1)
Co2: 38 mmol/L — ABNORMAL HIGH (ref 21–32)
Creatinine: 0.66 mg/dL (ref 0.60–1.30)
EGFR (African American): >60

## 2012-05-30 LAB — CULTURE, BLOOD (SINGLE)

## 2012-05-30 LAB — HEMOGLOBIN A1C: Hemoglobin A1C: 6.3 % (ref 4.2–6.3)

## 2012-05-30 NOTE — Telephone Encounter (Signed)
Atc back at # provided above x 3 - line busy each time.  wcb

## 2012-05-30 NOTE — Telephone Encounter (Signed)
Admitted to [facility] yesterday with possible overdose of his meds--got them mixed up Seemed to have worsening of confusion  Was able to get home again today  Spoke to wife Now had his antianxiety meds stopped  Lyla Son, Please call them to schedule a follow up in the next couple of days or next week

## 2012-05-30 NOTE — Telephone Encounter (Signed)
I spoke with patient's wife and scheduled appointment on 06/05/12 @ 2:00.

## 2012-05-31 ENCOUNTER — Telehealth: Payer: Self-pay

## 2012-05-31 ENCOUNTER — Telehealth: Payer: Self-pay | Admitting: Internal Medicine

## 2012-05-31 NOTE — Telephone Encounter (Signed)
Called to speak with wife or daughter because I was confused, I thought [facility] was [facility]? Received phone note earlier today from [facility] stating [facility] was at [facility] and wouldn't be discharged until 06/02/2012.  Spoke with daughter and could hear wife in the background, pt was discharged from [facility] Tuesday 05/30/12 for overdose of medication. Per daughter pt was changed from a lot of different medications, lorazepam, xanax and [facility] did some changes also. Daughter and wife state pt is very confused, they are requesting help from someone, they don't know what else to do. I advised pt needs to be seen in person to discuss medications. I scheduled appt tomorrow at 1:45 with Dr.Letvak, advised daughter to bring in all pt's medications and bottles. I will fax request for all records from [facility].

## 2012-05-31 NOTE — Telephone Encounter (Signed)
Order should come from hospital if home health required--as well as demographics, etc His status has been tenuous and I am not sure of what services are most appropriate until I see him again (may be getting close to being hospice appropriate)

## 2012-05-31 NOTE — Telephone Encounter (Signed)
Caller: Janet/Spouse; Patient Name: Brett [name]; PCP: Karie Schwalbe.; Best Callback Phone Number: [phone]; Calling to update Dr.Letvak on Odies's condition and check on appt for 06/05/12; hospitalized 05/26/12 for confusion, which seems to be worsening; All emergent symptoms of Confusion, Disorientation, Agitation protocol ruled out except "new or worsening confusion, disorientation, or agitation"; disposition ED; Marylu Lund says she will keep that in mind; offered an appt 8/15, but she declined, saying that a appt would not suffice, so she opted to keep the appt on 8/19; says Adriell is getting upset about not being able to drive or do the finances; instructed her to call 911 if he was becoming harmful to her or himself

## 2012-05-31 NOTE — Telephone Encounter (Signed)
Brett [name] with [facility]; pt presently at [facility], scheduled for discharge on 06/02/12. Family requesting home health services when pt goes home. Need order, recent office notes, demographics with insurance info faxed to [phone]. Please advise. Angelique Blonder not sure why pt hospitalized.

## 2012-05-31 NOTE — Telephone Encounter (Signed)
Spoke with patient's wife-states that her daughter Lorene Dy has the paper with all the questions on it; will need to call back and speak with Triage nurse. Patient's wife is aware if questions are regarding pulmonary we can help her, if its more of a PCP concern then they should contact the patient's PCP. Either way the daughter will call us back.

## 2012-05-31 NOTE — Telephone Encounter (Signed)
Spoke with Amil Amen @ [facility] and she will let the family know.

## 2012-05-31 NOTE — Telephone Encounter (Signed)
Agree- go to ER if acutely worse  Will route this to Dr Alphonsus Sias

## 2012-06-01 ENCOUNTER — Ambulatory Visit (INDEPENDENT_AMBULATORY_CARE_PROVIDER_SITE_OTHER): Payer: Medicare Other | Admitting: Internal Medicine

## 2012-06-01 ENCOUNTER — Telehealth: Payer: Self-pay | Admitting: Pulmonary Disease

## 2012-06-01 ENCOUNTER — Encounter: Payer: Self-pay | Admitting: Internal Medicine

## 2012-06-01 VITALS — BP 108/60 | HR 87 | Temp 98.5°F | Wt 179.0 lb

## 2012-06-01 DIAGNOSIS — I959 Hypotension, unspecified: Secondary | ICD-10-CM

## 2012-06-01 DIAGNOSIS — J8410 Pulmonary fibrosis, unspecified: Secondary | ICD-10-CM

## 2012-06-01 NOTE — Patient Instructions (Addendum)
Please cancel the 8/19 appt

## 2012-06-01 NOTE — Assessment & Plan Note (Signed)
Likely confused due to hypoxia last week Seems back to baseline status Hypotension due to mistaken medication taking   Counseled about meds Did extensive med reconciliation for entire 15 minute visit

## 2012-06-01 NOTE — Telephone Encounter (Signed)
I agree that in person review of meds is needed

## 2012-06-01 NOTE — Progress Notes (Signed)
Subjective:    Patient ID: Brett [name], male    DOB: 1934/10/05, 78 y.o.   MRN: [id number]  HPI Was admitted to [facility] last week due to confusion May have been due to hypoxia Did have echo--EF normal, mild diastolic dysfunction, valve is okay Quickly got home though and seemed better  Then had episode on 8/12 where he dropped is pill box Thought he got it right but must have taken too much of something (revatio) Was admitted overnight Was hypotensive and needed levophed briefly  No major arthritic problems Right hand is stiff at times but not overly painful Some problems playing the piano Discussed not using the etodolac  Had been very confused Daughter here discussing this Seems to have been taking lorazepam or alprazolam on regular basis Clearer again finally today  Current Outpatient Prescriptions on File Prior to Visit  Medication Sig Dispense Refill  . ALPRAZolam (XANAX) 0.5 MG tablet Take 1 tablet (0.5 mg total) by mouth 3 (three) times daily as needed for sleep or anxiety.  30 tablet  0  . aspirin 81 MG tablet Take 81 mg by mouth daily.        Marland Kitchen azaTHIOprine (IMURAN) 50 MG tablet Take 50 mg by mouth daily.      . cetirizine (ZYRTEC) 10 MG tablet Take 10 mg by mouth at bedtime.       . Cholecalciferol (VITAMIN D3) 1000 UNITS CAPS Take by mouth daily.       . citalopram (CELEXA) 10 MG tablet Take 1 tablet (10 mg total) by mouth daily.  30 tablet  0  . colchicine 0.6 MG tablet Take 0.6 mg by mouth daily.      . furosemide (LASIX) 20 MG tablet Take 20 mg by mouth daily.      . Multiple Vitamin (MULTIVITAMIN) capsule Take 1 capsule by mouth daily.        Marland Kitchen omeprazole (PRILOSEC) 20 MG capsule Take 20 mg by mouth 2 (two) times daily.      . pravastatin (PRAVACHOL) 40 MG tablet Take 40 mg by mouth daily.      . predniSONE (DELTASONE) 20 MG tablet 40mg  po daily x 2 week then 30mg  po daily x 2 weeks then 20mg  po daily x 2 weeks then resume previous 10mg  po daily  63 tablet  0  .  sildenafil (REVATIO) 20 MG tablet Take 1 tablet (20 mg total) by mouth 3 (three) times daily.  90 tablet  3  . sulfamethoxazole-trimethoprim (BACTRIM DS) 800-160 MG per tablet 1 tablet every Monday, Wednesday, and Friday      . traMADol (ULTRAM) 50 MG tablet Take 1 tablet (50 mg total) by mouth every 8 (eight) hours as needed for pain.  90 tablet  0  . vitamin B-12 (CYANOCOBALAMIN) 1000 MCG tablet Take 1,000 mcg by mouth daily.        Marland Kitchen DISCONTD: famotidine (PEPCID) 20 MG tablet Take 20 mg by mouth at bedtime.          Allergies  Allergen Reactions  . Penicillins   . Sulfonamide Derivatives     REACTION: itching    Past Medical History  Diagnosis Date  . Allergy   . Hx of colonic polyps   . Diverticulitis   . Gout   . Hypertension   . Arthritis   . Hyperlipidemia   . GERD (gastroesophageal reflux disease)   . Pulmonary fibrosis   . Aortic stenosis   . Carotid stenosis   . Cataract   .  Anxiety   . Shortness of breath   . Cancer     skin ca on head & shoulder    Past Surgical History  Procedure Date  . Anomalous pulmonary venous return repair   . Cardiac catheterization   . Melanoma excision     shoulder    Family History  Problem Relation Age of Onset  . Diabetes Mother   . Cancer Sister     breast  . Diabetes Maternal Grandmother     History   Social History  . Marital Status: Married    Spouse Name: N/A    Number of Children: 1  . Years of Education: N/A   Occupational History  . retired Proofreader    Social History Main Topics  . Smoking status: Former Smoker -- 1.0 packs/day for 5 years    Types: Cigarettes    Quit date: 10/19/1963  . Smokeless tobacco: Never Used   Comment: quit in the 60's  . Alcohol Use: Yes     rarely  . Drug Use: No  . Sexually Active: Not Currently   Other Topics Concern  . Not on file   Social History Narrative   Retired Engineer, site.  No mold in home, no cedar in or around home.  No exposure to  chemicals, dusts, asbestos, pesticides.  Smoked very briefly (<4 years) in teenage years.     Review of Systems Having trouble sleeping still---hasn't used the xanax lately. Taken off his list by [facility]    Objective:   Physical Exam  Constitutional: He appears well-developed and well-nourished.       Does get some dyspnea just talking  Psychiatric: He has a normal mood and affect. His behavior is normal.          Assessment & Plan:

## 2012-06-01 NOTE — Telephone Encounter (Signed)
Increasing dyspnea - discharged 8/9 for low O2 satn - on 6L [place] - satn is 99%, no edema, no wheeze Took alprazolam 30 mins ago but still anxious/ dyspneic I asked him to take another 0.5 mg alprazolam in 30 mins, stay on sam elevel of O2. Triage, pl check with him early am & offer him appt .  ALVA,RAKESH V.

## 2012-06-02 NOTE — Telephone Encounter (Signed)
I spoke with pt and he stated he is feeling much better today. He stated he believes he was just having a panic attack and that is why he couldn't breathe. He stated he does not feel like he needed to come in be seen bc he was feeling better. I offered him to come in and see MW this AM bc he did have an appt but declined. Will forward to Dr. Kendrick Fries so he is aware of this as an FYI.

## 2012-06-02 NOTE — Telephone Encounter (Signed)
Pt called PCP, phone note in Epic. Carron Curie, CMA

## 2012-06-05 ENCOUNTER — Telehealth: Payer: Self-pay | Admitting: *Deleted

## 2012-06-05 ENCOUNTER — Ambulatory Visit: Payer: Medicare Other | Admitting: Internal Medicine

## 2012-06-05 NOTE — Telephone Encounter (Signed)
This is fine 

## 2012-06-05 NOTE — Telephone Encounter (Signed)
Brett [name] called and left message for verbal order for patient to have PT and social work. Is this okay?

## 2012-06-06 NOTE — Telephone Encounter (Signed)
LM for nurse and advised results

## 2012-06-07 ENCOUNTER — Telehealth: Payer: Self-pay | Admitting: Pulmonary Disease

## 2012-06-07 NOTE — Telephone Encounter (Signed)
Spoke with pt and now realizes that he has plenty of Prednisone to last till appt with Dr Kendrick Fries until 06-23-12.

## 2012-06-08 ENCOUNTER — Other Ambulatory Visit: Payer: Self-pay | Admitting: *Deleted

## 2012-06-08 MED ORDER — ALPRAZOLAM 0.5 MG PO TABS: 0.5000 mg | ORAL_TABLET | Freq: Three times a day (TID) | ORAL | Status: DC | PRN

## 2012-06-08 NOTE — Telephone Encounter (Signed)
rx called into pharmacy

## 2012-06-08 NOTE — Telephone Encounter (Signed)
Okay #90 x 0 

## 2012-06-08 NOTE — Telephone Encounter (Signed)
Last filled 05/26/2012

## 2012-06-09 ENCOUNTER — Telehealth: Payer: Self-pay | Admitting: Pulmonary Disease

## 2012-06-09 ENCOUNTER — Telehealth: Payer: Self-pay | Admitting: Internal Medicine

## 2012-06-09 DIAGNOSIS — J8410 Pulmonary fibrosis, unspecified: Secondary | ICD-10-CM

## 2012-06-09 NOTE — Telephone Encounter (Signed)
Spoke with pt's daughter. She states that the pt is having increased confusion and since last admit to [facility], was admitted to [facility]. He is doing things way out of character for him and she is very concerned. Family is trying to get home health aid to help with meds since spouse has memory problems as well. OV with Dr. Kendrick Fries scheduled moved to Monday 06/12/12 at 1:30 (advised to arrive at 1:15). The daughter will try her best to attend the appt. She wants to have him seen to r/o o2 depletion being a possible cause of all of the increased confusion. Will forward to Dr Kendrick Fries so that he is aware.

## 2012-06-09 NOTE — Telephone Encounter (Signed)
It is very possible that during a low oxygen level, or not related to this, he had a stroke. This could be the reason for changes in thinking and judgement Medications could do this also ---or other metabolic problems There is no medication to make this better and I am not sure if we will find the reason (especially since he was just in the hospital and had testing done there)  He really needs more supervision May want to arrange for around the clock help---family and hired aides as needed Need to consider assisted living as soon as possible---he was the one to do the supervision in the household

## 2012-06-09 NOTE — Telephone Encounter (Signed)
pt's daughter Neysa Bonito concerned about pt's inconsistency and mental status; pt appears more confused, Christy prepares medicine box for pt and pt mixes the meds. Pt's behavior is out of character and bizarre at times. Neysa Bonito thinks more going on than O2 depletion to brain.  Pt writing checks for large amounts . Neysa Bonito concerned for pts safety in taking meds. Neysa Bonito concerned about pt taking Xanax as an emergency drug to open pts airway. Pt also has placed call to Dr Kendrick Fries.Please advise. [facility] [address] [facility].

## 2012-06-09 NOTE — Telephone Encounter (Signed)
Spoke with patient's daughter, Neysa Bonito and advised results, they are in the process of getting things in order for assisted living with [facility] being their choice. Per daughter pt is continuing to mix up medications, daughter will check with [facility] to have medications pre-packaged so that pt can't mix them up.

## 2012-06-10 NOTE — Telephone Encounter (Signed)
Please check with her on Monday to see how they are doing

## 2012-06-12 ENCOUNTER — Encounter: Payer: Self-pay | Admitting: Pulmonary Disease

## 2012-06-12 ENCOUNTER — Ambulatory Visit (INDEPENDENT_AMBULATORY_CARE_PROVIDER_SITE_OTHER): Payer: Medicare Other | Admitting: Pulmonary Disease

## 2012-06-12 VITALS — BP 102/60 | HR 90 | Temp 97.7°F | Ht 70.0 in | Wt 181.0 lb

## 2012-06-12 DIAGNOSIS — F29 Unspecified psychosis not due to a substance or known physiological condition: Secondary | ICD-10-CM

## 2012-06-12 DIAGNOSIS — R0600 Dyspnea, unspecified: Secondary | ICD-10-CM

## 2012-06-12 DIAGNOSIS — J9611 Chronic respiratory failure with hypoxia: Secondary | ICD-10-CM

## 2012-06-12 DIAGNOSIS — I2789 Other specified pulmonary heart diseases: Secondary | ICD-10-CM

## 2012-06-12 DIAGNOSIS — J8410 Pulmonary fibrosis, unspecified: Secondary | ICD-10-CM

## 2012-06-12 DIAGNOSIS — J9610 Chronic respiratory failure, unspecified whether with hypoxia or hypercapnia: Secondary | ICD-10-CM

## 2012-06-12 DIAGNOSIS — R0989 Other specified symptoms and signs involving the circulatory and respiratory systems: Secondary | ICD-10-CM

## 2012-06-12 DIAGNOSIS — R410 Disorientation, unspecified: Secondary | ICD-10-CM | POA: Insufficient documentation

## 2012-06-12 DIAGNOSIS — E538 Deficiency of other specified B group vitamins: Secondary | ICD-10-CM

## 2012-06-12 DIAGNOSIS — I272 Pulmonary hypertension, unspecified: Secondary | ICD-10-CM

## 2012-06-12 LAB — VITAMIN B12: Vitamin B-12: 370 pg/mL (ref 211–911)

## 2012-06-12 LAB — COMPREHENSIVE METABOLIC PANEL
ALT: 17 U/L (ref 0–53)
BUN: 18 mg/dL (ref 6–23)
CO2: 40 mEq/L — ABNORMAL HIGH (ref 19–32)
Calcium: 8.9 mg/dL (ref 8.4–10.5)
Chloride: 95 mEq/L — ABNORMAL LOW (ref 96–112)
Creat: 0.73 mg/dL (ref 0.50–1.35)
Glucose, Bld: 175 mg/dL — ABNORMAL HIGH (ref 70–99)
Total Bilirubin: 0.3 mg/dL (ref 0.3–1.2)

## 2012-06-12 LAB — TSH: TSH: 0.645 u[IU]/mL (ref 0.350–4.500)

## 2012-06-12 NOTE — Patient Instructions (Signed)
Decrease your prednisone to 10mg  daily Stop taking zyrtec.  You can use saline rinses over the counter as needed for nasal congestion if needed. Use xanax sparingly, only once a day at the most for panic/anxiety. Use the tramadol sparingly, once a day at the most for cough. Continue doing PT. Call Dr. Dennard Nip office to clarify when your next appointment time is scheduled. We will see you back in one month or sooner if needed.

## 2012-06-12 NOTE — Progress Notes (Signed)
Subjective:    Patient ID: Brett [name], male    DOB: 30-Sep-1934, 78 y.o.   MRN: [id number]  Synopsis: Brett [name] is a very pleasant male with diffuse parenchymal lung disease who has been followed by [name] since 2008.  At that time he was seen by Dr. Sherene [name] for shortness of breath and a dry cough.  He was seen by Dr. Sherene [name] who felt that his lung disease was due to chronic aspiration.  At that time a spartan serologic work up was negative.  Between 2008 and 2011 he was able to walk 3-4 miles a day. He went on oxygen in 2011.  In 2012 his exercise tolerance began to decline and his aspiration symptoms were worse.  He underwent an esophageal balloon dilation in 10/2011.  He was hospitalized for pneumonia and volume overload in March 2013. He was diuresed, given antibiotics and treated with prednisone for several weeks.  After he came off the prednisone he developed bronchitis and was hospitalized again in May with volume overload.  He was treated with diuresis and was started on Revatio for pulmonary hypertension.  Mar 07 2012 6 MW 790 feet, O2 saturation 95% at exercise on 55% VM.  In summary, he has been in a rapid state of decline since mid 2012 and it is unclear if prednisone was helpful.  HPI  09/29/11 2 week return visit:  He returns today for follow up on his diffuse parenchymal lung disease. He has undergone a barium swallow and a repeat CT scan, see below. He has had no change in his cough and shortness of breath. He is not exercising.  He also notes left lower quadrant cramping abdominal pain for one day. No fever, chills, nausea, vomiting, diarrhea. He says that this feels like his prior episodes of diverticulitis.  11/26/11 ROV -- [name] returns for a follow up on his pulmonary fibrosis believed to be due to aspiration.  He underwent a balloon dilation of his esophagus this week after the esophogram showed a distal stricture.  I don't have the report from this yet. He is  participating in pulmonary rehab and feels like it is going well.  He states that his breathing is about the same as when I saw him before.  He has lost a few pounds intentionally. His cough is at baseline.  He is planning a trip to [place] soon.  01/06/12 ROV-- Brett [name] was recently hosptialized after his trip to [place].  He developed a cough with sputum production and shortness of breath.  He was diagnosed with pneumonia, fluid overload, and apparently liver dysfunction (I do not have a discharge summary).  Apparently he stayed in the ICU for several days on high flow oxygen and was treated with antibiotics, steroids, and diuretics  He was discharged with new prescriptions for Advair and Spiriva and 30mg  daily of prednisone.  His cough persists but he does not produce sputum.  He feels better but is clearly weaker than prior to his hospitalization.  He has started working in pulmonary rehab again at [name].  His oxygen requirements are now higher at 6L/min  4/15 ROV -- Brett [name] is feeling great and states that his breathing is doing OK. Since his last visit with [place] he has tapered the prednisone, in fact he stopped prednisone today.  He has no cough and states that his exercise tolerance has improved since [name] discharge.  He stopped diuresis regularly on 3/28 when his weight was 188 lbs at Dr. Karle [name]  [name].  His weight has drifted up somewhat since then (he says his daily bathroom weight is ~193lbs.)   He is in good spirits.  He needs to have a skin cancer removed from his left ear.  It has been a few weeks since he has been in pulmonary rehab.  03/20/2012 ROV -- Brett [name] feels that he has been doing well since his last hospitalization and starting on the revatio. His cough is doing well, his swelling is at baseline and his weight is stable. He is planning to follow up with [name] next week. He thinks that his exercise tolerance is improving as he notes that his oxygenation has been  "bouncing up above 90" faster recently.  He has not had fevers, chills, or chest pain.      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 Surprisingly repeated lab work performed at [name] was markedly different than we had seen here at our center in that he had a very high ANA titer at 1: 2560. 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 Since that visit [name] has had improved shortness of breath and no cough. He denies fevers chills or chest pain. His leg swelling has markedly improved and overall he feels well. He continues to take the prednisone Imuran and Bactrim as written by [name]. He is concerned about weight loss and states that his appetite has been poor. He is planning to start pulmonary rehabilitation again on Wednesday of this week.  06/12/2012 ROV -- Brett [name] was hospitalized again 2 weeks ago this time in the setting of confusion and hypotension. It is believed that he overdosed on the sildenafil therapy. According to he and his family this was an unintentional overdose and was believed to be due to the confusion that he's been suffering from for the past several weeks. His family states that he has not taken Xanax at all for the last 4 days and that his confusion is improved greatly since this. He states that his breathing has "been never better" and overall he feels well. He is currently taking prednisone 20 mg daily. His cough is minimal and he has not had chest pain, fever, or chills. Physical therapy and a home health nurse are coming out to his house.   Past Medical History  Diagnosis Date  . Allergy   . Hx of colonic  polyps   . Diverticulitis   . Gout   . Hypertension   . Arthritis   . Hyperlipidemia   . GERD (gastroesophageal reflux disease)   . Pulmonary fibrosis   . Aortic stenosis   . Carotid stenosis   . Cataract   . Anxiety   . Shortness of breath   . Cancer     skin ca on head & shoulder     Review of Systems  Constitutional: Positive for fatigue. Negative for fever, chills and unexpected weight change.  Respiratory: Positive for shortness of breath. Negative for cough, choking, chest tightness and wheezing.   Cardiovascular: Negative for chest pain, palpitations and leg swelling.    Objective:   Physical Exam   Filed Vitals:   06/12/12  1328  BP: 102/60  Pulse: 90  Temp: 97.7 F (36.5 C)  TempSrc: Oral  Height: 5\' 10"  (1.778 m)  Weight: 181 lb (82.101 kg)  SpO2: 96%    Gen: chronically ill white male in good spirits and no acute distress HEENT: NCAT, PERRL, EOMi,  PULM: Insp crackles in bases only, otherwise clear to auscultation CV: RRR, no mgr, no JVD Ext: warm, no edema noted, notable for clubbing, no cyanosis Neuro: A&Ox4, MAEW   Full PFT's from 07/2010: FEV1 1.84, (63%), Raio 86%, TLC 7.07 (105%), DLCO 41%  CT Thorax from 09/2011 showed progressive fibrosis compared to the 2008 study.   Radiology felt that he had traction bronchiectasis and honeycombing in the bases and periphery which radiology felt was due to UIP.  I think that he has clear bronchiectasis and interstitial thickening but the honeycombing is less discreet.  I am uncertain if this represents a UIP pattern.  Mar 07 2012 6 MW 790 feet, O2 saturation 95% at exercise on 55% VM    Assessment & Plan:   Chronic hypoxemic respiratory failure He continues to use and benefit from chronic oxygen therapy. Plan: -Continue to use 4 L of oxygen with rest -Continue to use 6 L of oxygen with exertion.  PULMONARY FIBROSIS In terms of his subjective reports and his oxygenation I think this is been a stable  interval for Brett [name]. However he's had such a difficult time with confusion and has been in and out of the [name] and don't have anything that objectively measures do change since she's been on the Imuran. The CT scan of his chest did show fibrosis which had progressed slightly since the January study which is not surprising.  Plan: -Continue the Imuran at dose. -Check please metabolic panel today to ensure no LFT abnormalities on Imuran -Decrease the prednisone to 10 mg daily -Continue Bactrim -Continue oxygen therapy as written -Continue pulmonary rehabilitation  Pulmonary hypertension Brett [name] is continuing to use the revatio for secondary pulmonary hypertension. The last few months there was a study published in chest which showed benefit in terms of exercise tolerance and symptoms in patients who had IPF to her treated with revatio.  I am concerned about his continued use of this drug as he clearly had a recent overdose requiring hospitalization. Given the fact that there is no clear mortality benefit from this drug he continues to have episodes of confusion despite the measures mentioned below but may have to stop it altogether.  Confusion [name] has been suffering with confusion for the last several weeks. Chronologically this developed after starting Imuran though I am not convinced that this is the etiology. I explained to him that the differential diagnosis of his confusion is broad and includes hypoxemia from his oxygen running out at home, medication use including the tramadol, over-the-counter antihistamine, prednisone, and Xanax. The differential diagnosis also includes hypotension from the Revatio. It is also possible that he is developing dementia, but this would be an unusually accelerated course.  I am encouraged by the fact that his daughter and his family state that the confusion has been much better in the last 4 days since he has stopped using the Xanax. Overall I think it is  likely that the benzodiazepine, antihistamine, and tramadol are causing this but unfortunately with his severe cough and anxiety he does benefit from their use.  We can decrease the prednisone.  Plan: -decrease prednisone to 10mg  daily -Check a comprehensive metabolic panel to ensure no liver damage  from Imuran -Check a TSH, folate, vitamin B12 -Check MRI brain with and without contrast to look for evidence of stroke or less likely a mass not seen on the recent noncontrast head CT performed at [place]. -Continue to abstain from Xanax except and periods of extreme anxiety or panic -Abstain from using tramadol unless severe coughing paroxysms -Continue home medication regimen review with home health nurse    Updated Medication List Outpatient Encounter Prescriptions as of 06/12/2012  Medication Sig Dispense Refill  . ALPRAZolam (XANAX) 0.5 MG tablet Take 1 tablet (0.5 mg total) by mouth 3 (three) times daily as needed. Only for severe anxiety or breathlessness  90 tablet  0  . aspirin 81 MG tablet Take 81 mg by mouth daily.        Marland Kitchen azaTHIOprine (IMURAN) 50 MG tablet Take 50 mg by mouth daily.      . cetirizine (ZYRTEC) 10 MG tablet Take 10 mg by mouth at bedtime.       . Cholecalciferol (VITAMIN D3) 1000 UNITS CAPS Take by mouth daily.       . colchicine 0.6 MG tablet Take 0.6 mg by mouth daily.      . furosemide (LASIX) 20 MG tablet Take 20 mg by mouth daily.      . Multiple Vitamin (MULTIVITAMIN) capsule Take 1 capsule by mouth daily.        Marland Kitchen omeprazole (PRILOSEC) 20 MG capsule Take 20 mg by mouth 2 (two) times daily.      . pravastatin (PRAVACHOL) 40 MG tablet Take 40 mg by mouth daily.      . predniSONE (DELTASONE) 20 MG tablet 40mg  po daily x 2 week then 30mg  po daily x 2 weeks then 20mg  po daily x 2 weeks then resume previous 10mg  po daily  63 tablet  0  . sildenafil (REVATIO) 20 MG tablet Take 1 tablet (20 mg total) by mouth 3 (three) times daily.  90 tablet  3  .  sulfamethoxazole-trimethoprim (BACTRIM DS) 800-160 MG per tablet 1 tablet every Monday, Wednesday, and Friday      . traMADol (ULTRAM) 50 MG tablet Take 1 tablet (50 mg total) by mouth every 8 (eight) hours as needed for pain.  90 tablet  0  . vitamin B-12 (CYANOCOBALAMIN) 1000 MCG tablet Take 1,000 mcg by mouth daily.

## 2012-06-12 NOTE — Assessment & Plan Note (Signed)
He continues to use and benefit from chronic oxygen therapy. Plan: -Continue to use 4 L of oxygen with rest -Continue to use 6 L of oxygen with exertion.

## 2012-06-12 NOTE — Assessment & Plan Note (Signed)
In terms of his subjective reports and his oxygenation I think this is been a stable interval for [name]. However he's had such a difficult time with confusion and has been in and out of the hospital and don't have anything that objectively measures do change since she's been on the Imuran. The CT scan of his chest did show fibrosis which had progressed slightly since the January study which is not surprising.  Plan: -Continue the Imuran at dose. -Check please metabolic panel today to ensure no LFT abnormalities on Imuran -Decrease the prednisone to 10 mg daily -Continue Bactrim -Continue oxygen therapy as written -Continue pulmonary rehabilitation

## 2012-06-12 NOTE — Assessment & Plan Note (Addendum)
[name] has been suffering with confusion for the last several weeks. Chronologically this developed after starting Imuran though I am not convinced that this is the etiology. I explained to him that the differential diagnosis of his confusion is broad and includes hypoxemia from his oxygen running out at home, medication use including the tramadol, over-the-counter antihistamine, prednisone, and Xanax. The differential diagnosis also includes hypotension from the Revatio. It is also possible that he is developing dementia, but this would be an unusually accelerated course.  I am encouraged by the fact that his daughter and his family state that the confusion has been much better in the last 4 days since he has stopped using the Xanax. Overall I think it is likely that the benzodiazepine, antihistamine, and tramadol are causing this but unfortunately with his severe cough and anxiety he does benefit from their use.  We can decrease the prednisone.  Plan: -decrease prednisone to 10mg  daily -Check a comprehensive metabolic panel to ensure no liver damage from Imuran -Check a TSH, folate, vitamin B12 -Check MRI brain with and without contrast to look for evidence of stroke or less likely a mass not seen on the recent noncontrast head CT performed at [place]. -Continue to abstain from Xanax except and periods of extreme anxiety or panic -Abstain from using tramadol unless severe coughing paroxysms -Continue home medication regimen review with home health nurse

## 2012-06-12 NOTE — Assessment & Plan Note (Signed)
Brett [name] is continuing to use the revatio for secondary pulmonary hypertension. The last few months there was a study published in chest which showed benefit in terms of exercise tolerance and symptoms in patients who had IPF to her treated with revatio.  I am concerned about his continued use of this drug as he clearly had a recent overdose requiring hospitalization. Given the fact that there is no clear mortality benefit from this drug he continues to have episodes of confusion despite the measures mentioned below but may have to stop it altogether.

## 2012-06-12 NOTE — Telephone Encounter (Signed)
noted 

## 2012-06-13 NOTE — Telephone Encounter (Signed)
Spoke with wife, his confusion comes and goes and when it goes it goes way out, wife is requesting hospice. Wife states pt is doing all kinds strange things, today he shaved his beard off, that he had for over 50 years. Also received call from [place] from hospice and she also received a call from wife requesting hospice, per denise she would need orders faxed to her at [phone]. Please advise

## 2012-06-13 NOTE — Progress Notes (Signed)
Quick Note:  Called, spoke with pt. I informed him of lab results and resc per Dr. Kendrick Fries. He verbalized understanding of this and will contact Dr. Alphonsus Sias about the hyperglycemia. Nothing further needed at this time. ______

## 2012-06-14 ENCOUNTER — Telehealth: Payer: Self-pay | Admitting: Internal Medicine

## 2012-06-14 NOTE — Telephone Encounter (Signed)
See other phone note Referral made to hospice of 

## 2012-06-14 NOTE — Telephone Encounter (Signed)
Hospice referral called into [facility] they will call patient today.

## 2012-06-14 NOTE — Telephone Encounter (Signed)
Discussed with wife Will proceed with hospice referral---they can use the support and can be followed even if they move to assisted living

## 2012-06-14 NOTE — Telephone Encounter (Signed)
Triage Record Num: [id number] Operator: April Finney Patient Name: Brett [name] Call Date & Time: 06/13/2012 5:47:59PM Patient Phone: [phone] PCP: Tillman Abide Patient Gender: Male PCP Fax : [phone] Patient DOB: 05/24/1934 Practice Name: [facility] Reason for Call: Caller: [name]/Patient; PCP: Tillman Abide (Family Practice); CB#: [phone]; Call regarding Elevated Blood Sugar; He had lab work this morning and was not fasting and wants to know what might make blood sugar high. He is taking prednisone. Reviewed information with him and instructed him to follow up with office in am. He is asymptomatic and has not had any trouble with his blood sugar and does not have a way of checking it. Protocol(s) Used: Information Only Calls, No Triage Recommended Outcome per Protocol: Provide Information or Advice Only Reason for Outcome: Health Information question and no triage Care Advice: ~ 06/13/2012 6:00:32PM Page 1 of 1 [id number]

## 2012-06-18 ENCOUNTER — Encounter: Payer: Self-pay | Admitting: Internal Medicine

## 2012-06-21 ENCOUNTER — Other Ambulatory Visit: Payer: Self-pay

## 2012-06-21 ENCOUNTER — Telehealth: Payer: Self-pay | Admitting: Pulmonary Disease

## 2012-06-21 NOTE — Telephone Encounter (Signed)
B[name] with [facility] said they are packaging meds on time for pt. Request refill Alprazolam. [facility]prazolam [facility] [address] [place]. [name] and if pt needs refill [name] will call back.

## 2012-06-21 NOTE — Telephone Encounter (Signed)
Spoke with Rosanne Ashing at [facility] pharmacy and informed him that Dr Alphonsus Sias last refilled Xanax for pt on 06-08-12 and they should contact his office for new rx.

## 2012-06-23 ENCOUNTER — Ambulatory Visit (INDEPENDENT_AMBULATORY_CARE_PROVIDER_SITE_OTHER): Payer: Medicare Other | Admitting: Pulmonary Disease

## 2012-06-23 ENCOUNTER — Encounter: Payer: Self-pay | Admitting: Pulmonary Disease

## 2012-06-23 ENCOUNTER — Telehealth: Payer: Self-pay | Admitting: Pulmonary Disease

## 2012-06-23 VITALS — BP 132/68 | HR 86 | Temp 98.6°F | Wt 181.0 lb

## 2012-06-23 DIAGNOSIS — I2789 Other specified pulmonary heart diseases: Secondary | ICD-10-CM

## 2012-06-23 DIAGNOSIS — J8410 Pulmonary fibrosis, unspecified: Secondary | ICD-10-CM

## 2012-06-23 DIAGNOSIS — I272 Pulmonary hypertension, unspecified: Secondary | ICD-10-CM

## 2012-06-23 MED ORDER — ALPRAZOLAM 0.5 MG PO TABS: 0.5000 mg | ORAL_TABLET | Freq: Every day | ORAL | Status: AC | PRN

## 2012-06-23 MED ORDER — PREDNISONE 20 MG PO TABS: 10.0000 mg | ORAL_TABLET | Freq: Every day | ORAL | Status: DC

## 2012-06-23 NOTE — Assessment & Plan Note (Addendum)
Advanced pulmonary fibrosis with imaging characteristics consistent with UIP. Objectively his hypoxemia is not worsening but his dementia/confusion has progressed rapidly to the point to where he has difficulty keeping up with his medications and using his oxygen appropriately.  I agree completely with the decision to start hospice at home.  Plan: -Continue hospice at home -Morphine as needed for severe shortness of breath -I've asked that his wife contact the hospice nurse and give her a copy of the instructions I made today. -Instructed him that he is only to be taking 10 mg of prednisone daily -Continue the Imuran for now as he seems to obtain some benefit from it is relatively easy to use -May need to stop the Imuran the prednisone altogether on the next visit and note that I am OK with any physician stopping this if they feel that it is hazard to him.

## 2012-06-23 NOTE — Telephone Encounter (Signed)
Spoke with Brooke from [facility] in regards to patients rx. Rx was supposed to read: Take .5mg  (10mg  total) daily.Marland KitchenMarland KitchenNOT 20mg   #30 2RF Verifying that patient is supposed to take 10mg  a day not 20mg .

## 2012-06-23 NOTE — Assessment & Plan Note (Signed)
Mr. Berta Minor confusion persists despite efforts to change his medication list on the last visit. Because the sildenafil is a relatively gained her strength for him (he is already been admitted to an ICU once for accidentally overdosing on it) with questionable benefit in the setting of secondary pulmonary hypertension atelectasis stop this medication today.  Plan:  -Discontinue sildenafil

## 2012-06-23 NOTE — Patient Instructions (Signed)
Stop taking the sildenafil Minimize use of the Xanax to once daily for anxiety Minimize use of the Tramadol to once daily for cough Take 10mg  prednisone daily, not 20mg  Call us with the hospice nurse's phone number We will see you back in 6 weeks or sooner if needed

## 2012-06-23 NOTE — Progress Notes (Signed)
Subjective:    Patient ID: Brett [name], male    DOB: 30-Sep-1934, 78 y.o.   MRN: [id number]  Synopsis: Brett [name] is a very pleasant male with diffuse parenchymal lung disease who has been followed by [name] since 2008.  At that time he was seen by Dr. [name] for shortness of breath and a dry cough.  He was seen by Dr. [name] who felt that his lung disease was due to chronic aspiration.  At that time a spartan serologic work up was negative.  Between 2008 and 2011 he was able to walk 3-4 miles a day. He went on oxygen in 2011.  In 2012 his exercise tolerance began to decline and his aspiration symptoms were worse.  He underwent an esophageal balloon dilation in 10/2011.  He was hospitalized for pneumonia and volume overload in March 2013. He was diuresed, given antibiotics and treated with prednisone for several weeks.  After he came off the prednisone he developed bronchitis and was hospitalized again in May with volume overload.  He was treated with diuresis and was started on Revatio for pulmonary hypertension.  Mar 07 2012 6 MW 790 feet, O2 saturation 95% at exercise on 55% VM.  In summary, he has been in a rapid state of decline since mid 2012 and it is unclear if prednisone was helpful.  HPI  09/29/11 2 week return visit:  He returns today for follow up on his diffuse parenchymal lung disease. He has undergone a barium swallow and a repeat CT scan, see below. He has had no change in his cough and shortness of breath. He is not exercising.  He also notes left lower quadrant cramping abdominal pain for one day. No fever, chills, nausea, vomiting, diarrhea. He says that this feels like his prior episodes of diverticulitis.  11/26/11 ROV -- Brett [name] returns for a follow up on his pulmonary fibrosis believed to be due to aspiration.  He underwent a balloon dilation of his esophagus this week after the esophogram showed a distal stricture.  I don't have the report from this yet. He is  participating in pulmonary rehab and feels like it is going well.  He states that his breathing is about the same as when I saw him before.  He has lost a few pounds intentionally. His cough is at baseline.  He is planning a trip to [place] soon.  01/06/12 ROV-- Brett [name] was recently hosptialized after his trip to [place].  He developed a cough with sputum production and shortness of breath.  He was diagnosed with pneumonia, fluid overload, and apparently liver dysfunction (I do not have a discharge summary).  Apparently he stayed in the ICU for several days on high flow oxygen and was treated with antibiotics, steroids, and diuretics  He was discharged with new prescriptions for Advair and Spiriva and 30mg  daily of prednisone.  His cough persists but he does not produce sputum.  He feels better but is clearly weaker than prior to his hospitalization.  He has started working in pulmonary rehab again at [facility].  His oxygen requirements are now higher at 6L/min  4/15 ROV -- Brett [name] is feeling great and states that his breathing is doing OK. Since his last visit with [place] he has tapered the prednisone, in fact he stopped prednisone today.  He has no cough and states that his exercise tolerance has improved since hospital discharge.  He stopped diuresis regularly on 3/28 when his weight was 188 lbs at [name]  office.  His weight has drifted up somewhat since then (he says his daily bathroom weight is ~193lbs.)   He is in good spirits.  He needs to have a skin cancer removed from his left ear.  It has been a few weeks since he has been in pulmonary rehab.  03/20/2012 ROV -- Brett [name] feels that he has been doing well since his last hospitalization and starting on the revatio. His cough is doing well, his swelling is at baseline and his weight is stable. He is planning to follow up with [name] office next week. He thinks that his exercise tolerance is improving as he notes that his oxygenation has been  "bouncing up above 90" faster recently.  He has not had fevers, chills, or chest pain.      05/08/2012 ROV --Since his last visit  Brett [name] has been the [facility] and saw [name] who feels that the pattern of his interstitial lung disease is most consistent with UIP. Surprisingly repeated lab work performed at [facility] was markedly different than we had seen here at our center in that he had a very high ANA titer at 1: 2560. Based on this and his esophageal symptoms as well as the proximal muscle weakness 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 Since that visit Brett [name] has had improved shortness of breath and no cough. He denies fevers chills or chest pain. His leg swelling has markedly improved and overall he feels well. He continues to take the prednisone Imuran and Bactrim as written by [name]. He is concerned about weight loss and states that his appetite has been poor. He is planning to start pulmonary rehabilitation again on Wednesday of this week.  06/12/2012 ROV -- Brett [name] was hospitalized again 2 weeks ago this time in the setting of confusion and hypotension. It is believed that he overdosed on the sildenafil therapy. According to he and his family this was an unintentional overdose and was believed to be due to the confusion that he's been suffering from for the past several weeks. His family states that he has not taken Xanax at all for the last 4 days and that his confusion is improved greatly since this. He states that his breathing has "been never better" and overall he feels well. He is currently taking prednisone 20 mg daily. His cough is minimal and he has not had chest pain, fever, or chills. Physical therapy and a home health nurse are coming out to his house.  September 6,2013 ROV--Since the last visit Brett [name] has not been  hospitalized thankfully but he has started on hospice at home. He has a nurse coming twice a week and a physical therapist coming twice a week. According to his wife his confusion persists despite the fact that they try to cut back on many of his medications. He is still having difficulty with administering medications correctly despite the fact that the nurse and his wife try to help him with this he sometimes takes too many of some medications accidentally. He says it is not feeling short of breath unless he takes a "long walk" in the house to the bathroom. He wants to continue exercising and to still get out and see his friends if possible.   Past Medical History  Diagnosis Date  . Allergy   . Hx of colonic polyps   .  Diverticulitis   . Gout   . Hypertension   . Arthritis   . Hyperlipidemia   . GERD (gastroesophageal reflux disease)   . Pulmonary fibrosis   . Aortic stenosis   . Carotid stenosis   . Cataract   . Anxiety   . Shortness of breath   . Cancer     skin ca on head & shoulder     Review of Systems  Constitutional: Positive for fatigue. Negative for fever, chills and unexpected weight change.  Respiratory: Positive for shortness of breath. Negative for cough, choking, chest tightness and wheezing.   Cardiovascular: Negative for chest pain, palpitations and leg swelling.    Objective:   Physical Exam   Filed Vitals:   06/23/12 1616  BP: 132/68  Pulse: 86  Temp: 98.6 F (37 C)  TempSrc: Oral  Weight: 181 lb (82.101 kg)  SpO2: 99%    Gen: chronically ill white male in good spirits and no acute distress HEENT: NCAT, PERRL, EOMi,  PULM: Insp crackles in bases only, otherwise clear to auscultation CV: RRR, no mgr, no JVD Ext: warm, no edema noted, notable for clubbing, no cyanosis Neuro: A&Ox4, MAEW   Full PFT's from 07/2010: FEV1 1.84, (63%), Raio 86%, TLC 7.07 (105%), DLCO 41%  CT Thorax from 09/2011 showed progressive fibrosis compared to the 2008 study.    Radiology felt that he had traction bronchiectasis and honeycombing in the bases and periphery which radiology felt was due to UIP.  I think that he has clear bronchiectasis and interstitial thickening but the honeycombing is less discreet.  I am uncertain if this represents a UIP pattern.  Mar 07 2012 6 MW 790 feet, O2 saturation 95% at exercise on 55% VM    Assessment & Plan:   Pulmonary hypertension Mr. Wooding's confusion persists despite efforts to change his medication list on the last visit. Because the sildenafil is a relatively gained her strength for him (he is already been admitted to an ICU once for accidentally overdosing on it) with questionable benefit in the setting of secondary pulmonary hypertension atelectasis stop this medication today.  Plan:  -Discontinue sildenafil  PULMONARY FIBROSIS Advanced pulmonary fibrosis with imaging characteristics consistent with UIP. Objectively his hypoxemia is not worsening but his dementia/confusion has progressed rapidly to the point to where he has difficulty keeping up with his medications and using his oxygen appropriately.  I agree completely with the decision to start hospice at home.  Plan: -Continue hospice at home -Morphine as needed for severe shortness of breath -I've asked that his wife contact the hospice nurse and give her a copy of the instructions I made today. -Instructed him that he is only to be taking 10 mg of prednisone daily -Continue the Imuran for now as he seems to obtain some benefit from it is relatively easy to use -May need to stop the Imuran the prednisone altogether on the next visit and note that I am OK with any physician stopping this if they feel that it is hazard to him.    Updated Medication List Outpatient Encounter Prescriptions as of 06/23/2012  Medication Sig Dispense Refill  . ALPRAZolam (XANAX) 0.5 MG tablet Take 1 tablet (0.5 mg total) by mouth daily as needed for anxiety. Only for severe  anxiety or breathlessness  90 tablet  0  . aspirin 81 MG tablet Take 81 mg by mouth daily.        Marland Kitchen azaTHIOprine (IMURAN) 50 MG tablet Take 50 mg by  mouth daily.      . cetirizine (ZYRTEC) 10 MG tablet Take 10 mg by mouth at bedtime.       . Cholecalciferol (VITAMIN D3) 1000 UNITS CAPS Take by mouth daily.       . colchicine 0.6 MG tablet Take 0.6 mg by mouth daily.      . furosemide (LASIX) 20 MG tablet Take 20 mg by mouth daily.      . Multiple Vitamin (MULTIVITAMIN) capsule Take 1 capsule by mouth daily.        Marland Kitchen omeprazole (PRILOSEC) 20 MG capsule Take 20 mg by mouth 2 (two) times daily.      . pravastatin (PRAVACHOL) 40 MG tablet Take 40 mg by mouth daily.      . predniSONE (DELTASONE) 20 MG tablet Take 0.5 tablets (10 mg total) by mouth daily. 20 mg daily  30 tablet  2  . sulfamethoxazole-trimethoprim (BACTRIM DS) 800-160 MG per tablet 1 tablet every Monday, Wednesday, and Friday      . traMADol (ULTRAM) 50 MG tablet Take 1 tablet (50 mg total) by mouth every 8 (eight) hours as needed for pain.  90 tablet  0  . DISCONTD: ALPRAZolam (XANAX) 0.5 MG tablet Take 1 tablet (0.5 mg total) by mouth 3 (three) times daily as needed. Only for severe anxiety or breathlessness  90 tablet  0  . DISCONTD: predniSONE (DELTASONE) 20 MG tablet 40mg  po daily x 2 week then 30mg  po daily x 2 weeks then 20mg  po daily x 2 weeks then resume previous 10mg  po daily  63 tablet  0  . DISCONTD: predniSONE (DELTASONE) 20 MG tablet 20 mg daily      . DISCONTD: sildenafil (REVATIO) 20 MG tablet Take 1 tablet (20 mg total) by mouth 3 (three) times daily.  90 tablet  3

## 2012-06-28 DIAGNOSIS — R0902 Hypoxemia: Secondary | ICD-10-CM

## 2012-06-28 DIAGNOSIS — J8410 Pulmonary fibrosis, unspecified: Secondary | ICD-10-CM

## 2012-06-29 ENCOUNTER — Telehealth: Payer: Self-pay | Admitting: Pulmonary Disease

## 2012-06-29 NOTE — Telephone Encounter (Signed)
Patient Instructions     Stop taking the sildenafil  Minimize use of the Xanax to once daily for anxiety  Minimize use of the Tramadol to once daily for cough  Take 10mg  prednisone daily, not 20mg   Call us with the hospice nurse's phone number  We will see you back in 6 weeks or sooner if needed     I spoke with pt and advised him that Dr. Kendrick Fries did not say anything about changing his lasix. He voiced his understanding and needed nothing further

## 2012-07-03 ENCOUNTER — Ambulatory Visit: Payer: Self-pay | Admitting: Pulmonary Disease

## 2012-07-03 LAB — CREATININE, SERUM: EGFR (African American): >60

## 2012-07-04 ENCOUNTER — Other Ambulatory Visit: Payer: Self-pay | Admitting: *Deleted

## 2012-07-04 NOTE — Telephone Encounter (Signed)
Received faxed request today asking for refill of Xanax, spoke with pharmacist at [facility] drug and he has already filled this today on 07/04/2012, original rx was from 06/21/12 for #90, they only gave pt #45 and filled for #45 again today, they do not have a record of refill on 06/23/2012 from [facility]. They instructions where changed from tid to qd per Dr.McQuaid. Please advise on who will be filling the xanax because the pharmacy and I where both confused.

## 2012-07-05 NOTE — Telephone Encounter (Signed)
.  left message to have patient return my call.  

## 2012-07-05 NOTE — Telephone Encounter (Signed)
I think the instructions reflected a decrease in case the xanax was part of his confusion.  I think we should keep it at tid prn but make sure he knows to take it only if he is really having problems with his breathing and is very anxious

## 2012-07-06 ENCOUNTER — Telehealth: Payer: Self-pay | Admitting: Pulmonary Disease

## 2012-07-06 NOTE — Telephone Encounter (Signed)
I spoke with Brett [name] and advised him of BQ last office note: Patient Instructions     Stop taking the sildenafil  Minimize use of the Xanax to once daily for anxiety  Minimize use of the Tramadol to once daily for cough  Take 10mg  prednisone daily, not 20mg   Call us with the hospice nurse's phone number  We will see you back in 6 weeks or sooner if needed    He wrote all this down and needed nothing further

## 2012-07-07 ENCOUNTER — Telehealth: Payer: Self-pay

## 2012-07-07 NOTE — Telephone Encounter (Signed)
Pt's wife dropped off form for pt for long term health care today; pt forgot to add an important note to form; since pt has been in hospital 2 -3 times this summer and cannot bathe himself without fainting; pt already assigned hospice home health aid  3 times weekly to bathe pt. Carolyn filling out form; info given to [place].

## 2012-07-08 NOTE — Telephone Encounter (Signed)
I can finish after she gets done with her part

## 2012-07-11 ENCOUNTER — Telehealth: Payer: Self-pay | Admitting: Pulmonary Disease

## 2012-07-11 NOTE — Telephone Encounter (Signed)
To my knowledge I wasn't waiting on a CT.

## 2012-07-11 NOTE — Telephone Encounter (Signed)
Called and spoke with patient requesting CT results done at [facility] done approx 1-2 wks ago.  Patient states he is not sure whether Dr. Kendrick Fries or Dr. Alphonsus Sias ordered and whom will call with results. I told patient I would ask Dr.McQuaid status on this.  I have looked through chart and doesn't not appear to have result notes yet,?? Patient verbalized understanding, Dr. Kendrick Fries please advise, Thank you!!

## 2012-07-13 ENCOUNTER — Telehealth: Payer: Self-pay

## 2012-07-13 NOTE — Telephone Encounter (Signed)
I have not gotten a report either Can you check on this?

## 2012-07-13 NOTE — Telephone Encounter (Signed)
Lisa with Hospice said pt has not gotten results of MRI of head done 2 weeks ago at [facility]. Misty Stanley request results called to her.Please advise.

## 2012-07-14 NOTE — Telephone Encounter (Signed)
I spoke with pt and advised him of this. He stated he did not have a CT scan done he had an MRI done and Dr. Kendrick Fries did order this. I looked in pt chart and does show MRI was ordered by Dr. Kendrick Fries. Please advise Dr. Kendrick Fries thanks

## 2012-07-17 ENCOUNTER — Ambulatory Visit: Payer: Medicare Other | Admitting: Internal Medicine

## 2012-07-17 NOTE — Telephone Encounter (Signed)
Spoke with pt and notified of results per BQ and pt verbalized understanding and denied any further questions.

## 2012-07-17 NOTE — Telephone Encounter (Signed)
It looks like Dr.McQuaid ordered this and they also are trying to get results, see 07/11/12 phone note to Dr.McQuaid. I will fax request to [facility] also. Patient coming in for OV today at 3:45

## 2012-07-17 NOTE — Telephone Encounter (Signed)
MRI report normal.  Good news!

## 2012-07-18 ENCOUNTER — Encounter: Payer: Self-pay | Admitting: Pulmonary Disease

## 2012-07-18 ENCOUNTER — Encounter: Payer: Self-pay | Admitting: Internal Medicine

## 2012-07-18 ENCOUNTER — Ambulatory Visit (INDEPENDENT_AMBULATORY_CARE_PROVIDER_SITE_OTHER): Payer: Medicare Other | Admitting: Pulmonary Disease

## 2012-07-18 VITALS — BP 110/60 | HR 88 | Temp 97.6°F | Ht 72.0 in | Wt 185.0 lb

## 2012-07-18 DIAGNOSIS — Z23 Encounter for immunization: Secondary | ICD-10-CM

## 2012-07-18 DIAGNOSIS — J8410 Pulmonary fibrosis, unspecified: Secondary | ICD-10-CM

## 2012-07-18 NOTE — Assessment & Plan Note (Addendum)
Brett [name] is at the hospice phase at this point. He has noticed increasing shortness of breath with minimal activities around the house which is actually a new complaint for [facility]. I am a little confused as to why he still taking the prednisone and Imuran as we've tried communicating with his family and with his hospice team and updated medication list.   Plan: -I've called the hospice coordinator to make sure that he does not take anymore prednisone and Imuran -We need to start using liquid morphine immediate release as needed for shortness of breath. He has this at home and again I need to coordinate this through the hospice nurse.

## 2012-07-18 NOTE — Progress Notes (Signed)
Subjective:    Patient ID: Brett [name], male    DOB: 30-Sep-1934, 78 y.o.   MRN: [id number]  Synopsis: [name] is a very pleasant male with diffuse parenchymal lung disease who has been followed by [name] since 2008.  At that time he was seen by Dr. [name] for shortness of breath and a dry cough.  He was seen by Dr. [name] who felt that his lung disease was due to chronic aspiration.  At that time a spartan serologic work up was negative.  Between 2008 and 2011 he was able to walk 3-4 miles a day. He went on oxygen in 2011.  In 2012 his exercise tolerance began to decline and his aspiration symptoms were worse.  He underwent an esophageal balloon dilation in 10/2011.  He was hospitalized for pneumonia and volume overload in March 2013. He was diuresed, given antibiotics and treated with prednisone for several weeks.  After he came off the prednisone he developed bronchitis and was hospitalized again in May with volume overload.  He was treated with diuresis and was started on Revatio for pulmonary hypertension.  Mar 07 2012 6 MW 790 feet, O2 saturation 95% at exercise on 55% VM.  In summary, he has been in a rapid state of decline since mid 2012 and it is unclear if prednisone was helpful.  HPI  09/29/11 2 week return visit:  He returns today for follow up on his diffuse parenchymal lung disease. He has undergone a barium swallow and a repeat CT scan, see below. He has had no change in his cough and shortness of breath. He is not exercising.  He also notes left lower quadrant cramping abdominal pain for one day. No fever, chills, nausea, vomiting, diarrhea. He says that this feels like his prior episodes of diverticulitis.  11/26/11 ROV -- [name] returns for a follow up on his pulmonary fibrosis believed to be due to aspiration.  He underwent a balloon dilation of his esophagus this week after the esophogram showed a distal stricture.  I don't have the report from this yet. He is  participating in pulmonary rehab and feels like it is going well.  He states that his breathing is about the same as when I saw him before.  He has lost a few pounds intentionally. His cough is at baseline.  He is planning a trip to [place] soon.  01/06/12 ROV-- [name] was recently hosptialized after his trip to [place].  He developed a cough with sputum production and shortness of breath.  He was diagnosed with pneumonia, fluid overload, and apparently liver dysfunction (I do not have a discharge summary).  Apparently he stayed in the ICU for several days on high flow oxygen and was treated with antibiotics, steroids, and diuretics  He was discharged with new prescriptions for Advair and Spiriva and 30mg  daily of prednisone.  His cough persists but he does not produce sputum.  He feels better but is clearly weaker than prior to his hospitalization.  He has started working in pulmonary rehab again at [facility].  His oxygen requirements are now higher at 6L/min  4/15 ROV -- [name] is feeling great and states that his breathing is doing OK. Since his last visit with [place] he has tapered the prednisone, in fact he stopped prednisone today.  He has no cough and states that his exercise tolerance has improved since hospital discharge.  He stopped diuresis regularly on 3/28 when his weight was 188 lbs at [name]  office.  His weight has drifted up somewhat since then (he says his daily bathroom weight is ~193lbs.)   He is in good spirits.  He needs to have a skin cancer removed from his left ear.  It has been a few weeks since he has been in pulmonary rehab.  03/20/2012 ROV -- Caydyn feels that he has been doing well since his last hospitalization and starting on the revatio. His cough is doing well, his swelling is at baseline and his weight is stable. He is planning to follow up with [name] office next week. He thinks that his exercise tolerance is improving as he notes that his oxygenation has been  "bouncing up above 90" faster recently.  He has not had fevers, chills, or chest pain.      05/08/2012 ROV --Since his last visit  Mr. Wescott has been the [facility] interstitial lung disease clinic and saw [name] who feels that the pattern of his interstitial lung disease is most consistent with UIP. Surprisingly repeated lab work performed at [facility] was markedly different than we had seen here at our center in that he had a very high ANA titer at 1: 2560. Based on this and his esophageal symptoms as well as the proximal muscle weakness 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 Since that visit Jakevious has had improved shortness of breath and no cough. He denies fevers chills or chest pain. His leg swelling has markedly improved and overall he feels well. He continues to take the prednisone Imuran and Bactrim as written by [name]. He is concerned about weight loss and states that his appetite has been poor. He is planning to start pulmonary rehabilitation again on Wednesday of this week.  06/12/2012 ROV -- Mr. Gadbois was hospitalized again 2 weeks ago this time in the setting of confusion and hypotension. It is believed that he overdosed on the sildenafil therapy. According to he and his family this was an unintentional overdose and was believed to be due to the confusion that he's been suffering from for the past several weeks. His family states that he has not taken Xanax at all for the last 4 days and that his confusion is improved greatly since this. He states that his breathing has "been never better" and overall he feels well. He is currently taking prednisone 20 mg daily. His cough is minimal and he has not had chest pain, fever, or chills. Physical therapy and a home health nurse are coming out to his house.  September 6,2013 ROV--Since the last visit Brinton has not been  hospitalized thankfully but he has started on hospice at home. He has a nurse coming twice a week and a physical therapist coming twice a week. According to his wife his confusion persists despite the fact that they try to cut back on many of his medications. He is still having difficulty with administering medications correctly despite the fact that the nurse and his wife try to help him with this he sometimes takes too many of some medications accidentally. He says it is not feeling short of breath unless he takes a "long walk" in the house to the bathroom. He wants to continue exercising and to still get out and see his friends if possible.  07/18/2012 ROV -- Miko comes to clinic today for a followup visit. He states he has been more short of  breath with minimal exertion around the house. He is tearful in clinic today reflecting on the fact that this is probably the last holiday season that he will spend with his friends and family. Apparently they had very nice service for him at his church on Sunday which made quite emotional. He has not had increased cough swelling or chest pain. He actually seems a little bit more clear in regards to his mental status in clinic today.   Past Medical History  Diagnosis Date  . Allergy   . Hx of colonic polyps   . Diverticulitis   . Gout   . Hypertension   . Arthritis   . Hyperlipidemia   . GERD (gastroesophageal reflux disease)   . Pulmonary fibrosis   . Aortic stenosis   . Carotid stenosis   . Cataract   . Anxiety   . Shortness of breath   . Cancer     skin ca on head & shoulder     Review of Systems  Constitutional: Positive for fatigue. Negative for fever, chills and unexpected weight change.  Respiratory: Positive for shortness of breath. Negative for cough, choking, chest tightness and wheezing.   Cardiovascular: Negative for chest pain, palpitations and leg swelling.    Objective:   Physical Exam   Filed Vitals:   07/18/12 1639  BP:  110/60  Pulse: 88  Temp: 97.6 F (36.4 C)  TempSrc: Oral  Height: 6' (1.829 m)  Weight: 185 lb (83.915 kg)  SpO2: 95%  4 L [place] at rest  Gen: chronically ill white male in good spirits and no acute distress HEENT: NCAT, PERRL, EOMi,  PULM: Insp crackles in bases only, otherwise clear to auscultation CV: RRR, no mgr, no JVD Ext: warm, no edema noted, notable for clubbing, no cyanosis Neuro: A&Ox4, MAEW   Full PFT's from 07/2010: FEV1 1.84, (63%), Raio 86%, TLC 7.07 (105%), DLCO 41%  CT Thorax from 09/2011 showed progressive fibrosis compared to the 2008 study.   Radiology felt that he had traction bronchiectasis and honeycombing in the bases and periphery which radiology felt was due to UIP.  I think that he has clear bronchiectasis and interstitial thickening but the honeycombing is less discreet.  I am uncertain if this represents a UIP pattern.  Mar 07 2012 6 MW 790 feet, O2 saturation 95% at exercise on 55% VM    Assessment & Plan:   PULMONARY FIBROSIS Mr. Berta Minor is at the hospice phase at this point. He has noticed increasing shortness of breath with minimal activities around the house which is actually a new complaint for [facility]. I am a little confused as to why he still taking the prednisone and Imuran as we've tried communicating with his family and with his hospice team and updated medication list.   Plan: -I've called the hospice coordinator to make sure that he does not take anymore prednisone and Imuran -We need to start using liquid morphine immediate release as needed for shortness of breath. He has this at home and again I need to coordinate this through the hospice nurse.    Updated Medication List Outpatient Encounter Prescriptions as of 07/18/2012  Medication Sig Dispense Refill  . ALPRAZolam (XANAX) 0.5 MG tablet Take 1 tablet (0.5 mg total) by mouth daily as needed for anxiety. Only for severe anxiety or breathlessness  90 tablet  0  . aspirin 81 MG tablet  Take 81 mg by mouth daily.        Marland Kitchen azaTHIOprine (IMURAN)  50 MG tablet Take 50 mg by mouth daily.      . cetirizine (ZYRTEC) 10 MG tablet Take 10 mg by mouth at bedtime.       . Cholecalciferol (VITAMIN D3) 1000 UNITS CAPS Take by mouth daily.       . colchicine 0.6 MG tablet Take 0.6 mg by mouth daily.      . furosemide (LASIX) 20 MG tablet Take 20 mg by mouth daily.      . Multiple Vitamin (MULTIVITAMIN) capsule Take 1 capsule by mouth daily.        Marland Kitchen omeprazole (PRILOSEC) 20 MG capsule Take 20 mg by mouth 2 (two) times daily.      . pravastatin (PRAVACHOL) 40 MG tablet Take 40 mg by mouth daily.      . predniSONE (DELTASONE) 20 MG tablet Take 10 mg by mouth daily.      Marland Kitchen sulfamethoxazole-trimethoprim (BACTRIM DS) 800-160 MG per tablet 1 tablet every Monday, Wednesday, and Friday      . traMADol (ULTRAM) 50 MG tablet Take 1 tablet (50 mg total) by mouth every 8 (eight) hours as needed for pain.  90 tablet  0  . DISCONTD: predniSONE (DELTASONE) 20 MG tablet Take 0.5 tablets (10 mg total) by mouth daily. 20 mg daily  30 tablet  2

## 2012-07-18 NOTE — Patient Instructions (Signed)
You need to stop the imuran and the prednisone I will call your hospice nurse to discuss this Use morphine as needed for shortness of breath We will see you back in 2 months or sooner if needed

## 2012-07-20 ENCOUNTER — Other Ambulatory Visit: Payer: Self-pay | Admitting: Pulmonary Disease

## 2012-07-20 MED ORDER — MORPHINE SULFATE (CONCENTRATE) 20 MG/ML PO SOLN: 5.0000 mg | ORAL | Status: AC | PRN

## 2012-07-21 ENCOUNTER — Telehealth: Payer: Self-pay

## 2012-07-21 NOTE — Telephone Encounter (Signed)
pts daughter left v/m requesting status of long term health ins form for [employer].  Ifaxed copy that is scanned under media to [phone]. I called spoke with Lorene Dy and she confirmed ins co had received fax.

## 2012-07-26 ENCOUNTER — Encounter: Payer: Self-pay | Admitting: Pulmonary Disease

## 2012-07-26 ENCOUNTER — Telehealth: Payer: Self-pay | Admitting: Pulmonary Disease

## 2012-07-26 NOTE — Telephone Encounter (Signed)
Spoke with pt. He states that he had appt with [facility] for next wk, and he cancelled it, but then they called back to remind him of another appt he had there next wk. He states that he does not have the stamina to travel all the way there, and it was his understanding that he would just follow with Dr. Kendrick Fries.  I advised that I think that this was the plan esp now that he is with Hospice, but will ask Dr. Kendrick Fries to be sure. Please advise, thanks!

## 2012-07-31 NOTE — Telephone Encounter (Signed)
Pt advised. Jennifer Castillo, CMA  

## 2012-07-31 NOTE — Telephone Encounter (Signed)
No need for him to go up there.  I clarified with [name] (at [facility]).

## 2012-08-01 ENCOUNTER — Telehealth: Payer: Self-pay

## 2012-08-01 NOTE — Telephone Encounter (Signed)
Brett [name] with [facility] left v/m pt gained 6 lbs in one week. New development pt has 1+ pitting in lower legs and feet. Pt taking Lasix 20 mg. Pt is more depressed this week due to medical problems with pts wife and request antidepressant. Pt presently taking xanax 1/2 tab. Please advise.

## 2012-08-02 NOTE — Telephone Encounter (Signed)
Discussed with patient and Susa Raring Can increase lasix to 40mg  if persistent fluid (but she is concerned about chronic low BP)  Will plan home visit on 10/18--- around 4:30PM

## 2012-08-04 ENCOUNTER — Ambulatory Visit: Payer: Medicare Other | Admitting: Internal Medicine

## 2012-08-04 ENCOUNTER — Encounter: Payer: Self-pay | Admitting: Internal Medicine

## 2012-08-04 VITALS — BP 102/62 | HR 78 | Resp 22

## 2012-08-04 DIAGNOSIS — I2789 Other specified pulmonary heart diseases: Secondary | ICD-10-CM

## 2012-08-04 DIAGNOSIS — I499 Cardiac arrhythmia, unspecified: Secondary | ICD-10-CM | POA: Insufficient documentation

## 2012-08-04 DIAGNOSIS — I272 Pulmonary hypertension, unspecified: Secondary | ICD-10-CM

## 2012-08-04 DIAGNOSIS — J9610 Chronic respiratory failure, unspecified whether with hypoxia or hypercapnia: Secondary | ICD-10-CM

## 2012-08-04 DIAGNOSIS — R0600 Dyspnea, unspecified: Secondary | ICD-10-CM

## 2012-08-04 DIAGNOSIS — F29 Unspecified psychosis not due to a substance or known physiological condition: Secondary | ICD-10-CM

## 2012-08-04 DIAGNOSIS — J9611 Chronic respiratory failure with hypoxia: Secondary | ICD-10-CM

## 2012-08-04 DIAGNOSIS — R0989 Other specified symptoms and signs involving the circulatory and respiratory systems: Secondary | ICD-10-CM

## 2012-08-04 DIAGNOSIS — R410 Disorientation, unspecified: Secondary | ICD-10-CM

## 2012-08-04 DIAGNOSIS — M1990 Unspecified osteoarthritis, unspecified site: Secondary | ICD-10-CM

## 2012-08-04 NOTE — Assessment & Plan Note (Signed)
Hasn't been as much of a problem Morphine probably helping--though using for dyspnea

## 2012-08-04 NOTE — Assessment & Plan Note (Signed)
Severe Now on hospice care---this has helped trememdously Housekeepers every 2 weeks Has aides for showering and personal care--3 days per week

## 2012-08-04 NOTE — Assessment & Plan Note (Signed)
Improved May have been medication related Still forgetful

## 2012-08-04 NOTE — Assessment & Plan Note (Signed)
Has had improvement in symptoms with the morphine

## 2012-08-04 NOTE — Progress Notes (Signed)
Subjective:    Patient ID: Brett [name], male    DOB: 1934-03-17, 78 y.o.   MRN: [id number]  HPI Wife is here Discussed status with Misty Stanley RN earlier this week  Not able to get out without significant difficulty Ongoing memory problems---may be some better now Able to do crosswords again and work on computer meds are prepared for him so he is doing this more consistently  Dyspnea is really severe at times Occ can do the sitting bicycle exercise--but not much Does have to rest before he can walk across his house No longer goes upstairs Oxygen sats will drop into the 70's at times Morphine does help the feeling of dyspnea--he uses 0.25cc of morphine. Often uses this 5 times per day  Furosemide just increased  Took a second one last night and didn't have any effect Discussed that he should be taking 40mg  once in AM  Yesterday his BP was up to 110 systolic--highest in some time No chest pain unless his breathing is very tight Had palpitation 2 nights ago---had trouble with oxygen cncentrator and was dyspneic (and water came out of cannula). Had been off O2 for about 30 minutes and he was really in distress. Hospice made an emergency visit then  Using xanax only fairly rarely Not using the tramadol lately  Current Outpatient Prescriptions on File Prior to Visit  Medication Sig Dispense Refill  . aspirin 81 MG tablet Take 81 mg by mouth daily.        . cetirizine (ZYRTEC) 10 MG tablet Take 10 mg by mouth at bedtime.       . Cholecalciferol (VITAMIN D3) 1000 UNITS CAPS Take by mouth daily.       . colchicine 0.6 MG tablet Take 0.6 mg by mouth daily.      . furosemide (LASIX) 20 MG tablet Take 40 mg by mouth daily.       Marland Kitchen morphine (ROXANOL) 20 MG/ML concentrated solution Take 0.25-0.5 mLs (5-10 mg total) by mouth every 2 (two) hours as needed (dyspnea).  30 mL  0  . Multiple Vitamin (MULTIVITAMIN) capsule Take 1 capsule by mouth daily.        Marland Kitchen omeprazole (PRILOSEC) 20 MG capsule  Take 20 mg by mouth 2 (two) times daily.      . pravastatin (PRAVACHOL) 40 MG tablet Take 40 mg by mouth daily.      Marland Kitchen sulfamethoxazole-trimethoprim (BACTRIM DS) 800-160 MG per tablet 1 tablet every Monday, Wednesday, and Friday      . traMADol (ULTRAM) 50 MG tablet Take 1 tablet (50 mg total) by mouth every 8 (eight) hours as needed for pain.  90 tablet  0  . DISCONTD: famotidine (PEPCID) 20 MG tablet Take 20 mg by mouth at bedtime.          Allergies  Allergen Reactions  . Penicillins   . Sulfonamide Derivatives     REACTION: itching    Past Medical History  Diagnosis Date  . Allergy   . Hx of colonic polyps   . Diverticulitis   . Gout   . Hypertension   . Arthritis   . Hyperlipidemia   . GERD (gastroesophageal reflux disease)   . Pulmonary fibrosis   . Aortic stenosis   . Carotid stenosis   . Cataract   . Anxiety   . Shortness of breath   . Cancer     skin ca on head & shoulder    Past Surgical History  Procedure Date  .  Anomalous pulmonary venous return repair   . Cardiac catheterization   . Melanoma excision     shoulder    Family History  Problem Relation Age of Onset  . Diabetes Mother   . Cancer Sister     breast  . Diabetes Maternal Grandmother     History   Social History  . Marital Status: Married    Spouse Name: N/A    Number of Children: 1  . Years of Education: N/A   Occupational History  . retired Proofreader    Social History Main Topics  . Smoking status: Former Smoker -- 1.0 packs/day for 5 years    Types: Cigarettes    Quit date: 10/19/1963  . Smokeless tobacco: Never Used   Comment: quit in the 60's  . Alcohol Use: Yes     rarely  . Drug Use: No  . Sexually Active: Not Currently   Other Topics Concern  . Not on file   Social History Narrative   Retired Engineer, site.  No mold in home, no cedar in or around home.  No exposure to chemicals, dusts, asbestos, pesticides.  Smoked very briefly (<4 years) in  teenage years.     Review of Systems Bowels have been soft--will see what happens with the increased morphine Appetite has been okay---thinks he has gained some weight (not sure if fluid or not) Sleeps "like a rock"    Objective:   Physical Exam  Constitutional: He is oriented to person, place, and time. He appears well-developed and well-nourished. No distress.       Comfortable sitting with oxygen on  Neck: Normal range of motion. Neck supple.  Cardiovascular: Normal rate and normal heart sounds.  Exam reveals no gallop.   No murmur heard.      irregular  Pulmonary/Chest: No respiratory distress. He has no wheezes. He has rales.       Bronchial breath sounds with loud crackles in bases Decreased breath sounds in apices  Musculoskeletal: He exhibits no edema and no tenderness.  Lymphadenopathy:    He has no cervical adenopathy.  Neurological: He is alert and oriented to person, place, and time.  Psychiatric: He has a normal mood and affect. His behavior is normal.          Assessment & Plan:

## 2012-08-04 NOTE — Assessment & Plan Note (Signed)
Now irregular ?atrial fibrillation (likely) or frequent ectopy Would just continue the aspirin

## 2012-08-04 NOTE — Assessment & Plan Note (Signed)
Has had significant weight gain No clear fluid in legs but still could be in lungs--interstitial He is eating more though Continue with the higher furosemide dose

## 2012-08-08 ENCOUNTER — Other Ambulatory Visit: Payer: Self-pay | Admitting: Internal Medicine

## 2012-08-08 MED ORDER — COLCHICINE 0.6 MG PO TABS: 0.6000 mg | ORAL_TABLET | Freq: Every day | ORAL | Status: DC

## 2012-08-08 NOTE — Telephone Encounter (Signed)
Pharmacy calling to get patient's Colcrys refilled for the patient for gout.  [phone] Rosanne Ashing at the pharmacy)

## 2012-08-08 NOTE — Telephone Encounter (Signed)
rx sent to pharmacy by e-script  

## 2012-08-09 ENCOUNTER — Telehealth: Payer: Self-pay | Admitting: Internal Medicine

## 2012-08-09 NOTE — Telephone Encounter (Signed)
Caller: [name]/Patient; Patient Name: Brett [name]; PCP: Tillman Abide [facility]); Best Callback Phone Number: [phone] [name] calling because he has been having stiffness of his neck since approximately 08/02/12.  Was seen by Dr. Alphonsus Sias via house call on 08/04/12.  Has a swollen area on the back of his neck that is the size of the palm of his hand.  Does not know if it is red but will have a caregiver look at it today.  Area is becoming more painful but is not severe and does not require pain medication.  Afebrile.  Utilized Neck Pain Guideline.  See PCP within 72 hrs disposition.  Additional home care advice given as well along with parameters regarding when to call back.  Pt is unable to come to office.  OFFICE, PLEASE F/U WITH PATIENT.

## 2012-08-09 NOTE — Telephone Encounter (Signed)
Spoke to wife, with husband in the backround ~1/2 dollar size elevated area---probably a cyst Not really warm or tender (unless really mashed)  Will try warm compress Would treat (keflex) if gets more inflamed

## 2012-08-09 NOTE — Telephone Encounter (Signed)
Or could use clinda given PCN allergy

## 2012-08-10 ENCOUNTER — Telehealth: Payer: Self-pay | Admitting: Internal Medicine

## 2012-08-10 NOTE — Telephone Encounter (Signed)
Phone call from Lisa--hospice nurse Cyst has gotten bigger  Will go ahead with clindamycin 300mg  tid for 7 days She will take care of the prescription

## 2012-08-17 ENCOUNTER — Other Ambulatory Visit: Payer: Self-pay | Admitting: *Deleted

## 2012-08-17 MED ORDER — FUROSEMIDE 20 MG PO TABS: 40.0000 mg | ORAL_TABLET | Freq: Every day | ORAL | Status: DC

## 2012-08-21 ENCOUNTER — Telehealth: Payer: Self-pay | Admitting: Pulmonary Disease

## 2012-08-21 NOTE — Telephone Encounter (Signed)
Spoke with Brett [name]. She states that the pharmacy never received the rx for morphine that I faxed on 11/1 I advised that the rx is in Dr. Ulyses Jarred scan folder in [place], so I will refax tomorrow am when I get there She states that this is fine, he has enough to last until 11/7 Will hold in my basket until done tommorrow

## 2012-08-22 NOTE — Telephone Encounter (Signed)
Rx was refaxed to [facility] Spoke with Dorathy Daft at the pharmacy and verified that it was received Nothing further needed

## 2012-09-07 ENCOUNTER — Other Ambulatory Visit: Payer: Self-pay | Admitting: *Deleted

## 2012-09-07 MED ORDER — COLCHICINE 0.6 MG PO TABS: 0.6000 mg | ORAL_TABLET | Freq: Every day | ORAL | Status: DC

## 2012-09-12 ENCOUNTER — Telehealth: Payer: Self-pay

## 2012-09-12 NOTE — Telephone Encounter (Signed)
Spoke with nurse and scheduled appt with Dr. Milinda Antis tomorrow @ 11:45

## 2012-09-12 NOTE — Telephone Encounter (Signed)
Last week pt had bruise on rt upper thigh 10 cm round; no fall thought bruise due to positioning. Today pt has patches of bruising from buttocks down rt leg to calf.No pain when walks but pt said feels tight. Right leg is swollen and appears tight but does not feel hot to touch and is not red but is slightly different color than lt leg.No fall. Pt takes ASA 81 mg daily.Please advise.

## 2012-09-12 NOTE — Telephone Encounter (Signed)
Needs to be seen by first available - I don't know what is going on with those symptoms  If pain in leg or any shortness of breath- go to ER for eval

## 2012-09-13 ENCOUNTER — Ambulatory Visit (INDEPENDENT_AMBULATORY_CARE_PROVIDER_SITE_OTHER): Payer: Medicare Other | Admitting: Family Medicine

## 2012-09-13 ENCOUNTER — Encounter: Payer: Self-pay | Admitting: Family Medicine

## 2012-09-13 VITALS — BP 110/72 | HR 84 | Temp 97.8°F | Ht 72.0 in | Wt 194.0 lb

## 2012-09-13 DIAGNOSIS — T148XXA Other injury of unspecified body region, initial encounter: Secondary | ICD-10-CM

## 2012-09-13 LAB — CBC WITH DIFFERENTIAL/PLATELET
Basophils Relative: 0.3 % (ref 0.0–3.0)
Eosinophils Absolute: 0.1 10*3/uL (ref 0.0–0.7)
Hemoglobin: 10.7 g/dL — ABNORMAL LOW (ref 13.0–17.0)
Lymphocytes Relative: 22.1 % (ref 12.0–46.0)
MCHC: 30.9 g/dL (ref 30.0–36.0)
Monocytes Relative: 3.7 % (ref 3.0–12.0)
Neutro Abs: 3.8 10*3/uL (ref 1.4–7.7)
Neutrophils Relative %: 71.2 % (ref 43.0–77.0)
RBC: 3.82 Mil/uL — ABNORMAL LOW (ref 4.22–5.81)
WBC: 5.3 10*3/uL (ref 4.5–10.5)

## 2012-09-13 LAB — APTT: aPTT: 34.1 s — ABNORMAL HIGH (ref 21.7–28.8)

## 2012-09-13 LAB — PROTIME-INR: INR: 1.4 ratio — ABNORMAL HIGH (ref 0.8–1.0)

## 2012-09-13 NOTE — Assessment & Plan Note (Signed)
Diffuse on R buttock and leg- presumably from trauma of sitting in a wicker chair sideways Reassuring exam- bruise is evolving  Pt is on asa  Will check clotting tests however in light of the extent of it  His edema is symmetric and no s/s of DVT Will update if worse or no improvement

## 2012-09-13 NOTE — Progress Notes (Signed)
Subjective:    Patient ID: Brett [name], male    DOB: [birth year], 78 y.o.   MRN: [id number]  HPI Here for bruising of leg   Called nurse line yesterday Reported large bruise on R upper thigh last week -- he was sitting sideways in a chair lately with legs crossed - that may have added to the bruising  Then more bruising down whole R leg with a feeling of tightness  No hx of blood clots  Tries hard to avoid sodium- watches that very carefully  No more winded than usual - baseline sob and pulse ox on 6L is 94%  Has baseline foot swelling -no change in that   Has complex med hx On asa , no other blood thinners   Has a hospice nurse - Misty Stanley  She comes twice weekly - has noted that this is improving  Family member saw it today- and states that her bruising is better   Wt is up from October but he thinks he lost 5 lb of fluid this week   Patient Active Problem List  Diagnosis  . HYPERLIPIDEMIA  . GOUT  . ANEMIA  . NEUROPATHY  . HYPERTENSION  . AORTIC STENOSIS  . ALLERGIC RHINITIS  . PULMONARY FIBROSIS  . RESPIRATORY FAILURE, CHRONIC  . GERD  . DIVERTICULOSIS, COLON  . OSTEOARTHRITIS  . COLONIC POLYPS, HX OF  . Dyspnea  . Pulmonary hypertension  . External hemorrhoid  . Chronic hypoxemic respiratory failure  . Weight loss  . Anxiety  . Hypoxemia  . Confusion  . Arrhythmia   Past Medical History  Diagnosis Date  . Allergy   . Hx of colonic polyps   . Diverticulitis   . Gout   . Hypertension   . Arthritis   . Hyperlipidemia   . GERD (gastroesophageal reflux disease)   . Pulmonary fibrosis   . Aortic stenosis   . Carotid stenosis   . Cataract   . Anxiety   . Shortness of breath   . Cancer     skin ca on head & shoulder   Past Surgical History  Procedure Date  . Anomalous pulmonary venous return repair   . Cardiac catheterization   . Melanoma excision     shoulder   History  Substance Use Topics  . Smoking status: Former Smoker -- 1.0 packs/day for  5 years    Types: Cigarettes    Quit date: 10/19/1963  . Smokeless tobacco: Never Used     Comment: quit in the 60's  . Alcohol Use: Yes     Comment: rarely   Family History  Problem Relation Age of Onset  . Diabetes Mother   . Cancer Sister     breast  . Diabetes Maternal Grandmother    Allergies  Allergen Reactions  . Penicillins   . Shrimp (Shellfish Allergy)   . Sulfonamide Derivatives     REACTION: itching   Current Outpatient Prescriptions on File Prior to Visit  Medication Sig Dispense Refill  . ALPRAZolam (XANAX) 0.5 MG tablet       . aspirin 81 MG tablet Take 81 mg by mouth daily.        . cetirizine (ZYRTEC) 10 MG tablet Take 10 mg by mouth at bedtime.       . Cholecalciferol (VITAMIN D3) 1000 UNITS CAPS Take by mouth daily.       . colchicine 0.6 MG tablet Take 1 tablet (0.6 mg total) by mouth daily.  30 tablet  5  . furosemide (LASIX) 20 MG tablet Take 2 tablets (40 mg total) by mouth daily.  60 tablet  1  . morphine (ROXANOL) 20 MG/ML concentrated solution Take 0.25-0.5 mLs (5-10 mg total) by mouth every 2 (two) hours as needed (dyspnea).  30 mL  0  . Multiple Vitamin (MULTIVITAMIN) capsule Take 1 capsule by mouth daily.        Marland Kitchen omeprazole (PRILOSEC) 20 MG capsule Take 20 mg by mouth 2 (two) times daily.      . pravastatin (PRAVACHOL) 40 MG tablet Take 40 mg by mouth daily.      Marland Kitchen sulfamethoxazole-trimethoprim (BACTRIM DS) 800-160 MG per tablet 1 tablet every Monday, Wednesday, and Friday      . traMADol (ULTRAM) 50 MG tablet Take 1 tablet (50 mg total) by mouth every 8 (eight) hours as needed for pain.  90 tablet  0  . [DISCONTINUED] famotidine (PEPCID) 20 MG tablet Take 20 mg by mouth at bedtime.              Review of Systems Review of Systems  Constitutional: Negative for fever, appetite change, fatigue and unexpected weight change.  Eyes: Negative for pain and visual disturbance.  Respiratory: Negative for cough and shortness of breath.     Cardiovascular: Negative for cp or palpitations    Gastrointestinal: Negative for nausea, diarrhea and constipation.  Genitourinary: Negative for urgency and frequency.  Skin: Negative for pallor or rash   MSK pos for bruising of R leg with minimal soreness and swelling  Neurological: Negative for weakness, light-headedness, numbness and headaches.  Hematological: Negative for adenopathy. Does not bruise/bleed easily.  Psychiatric/Behavioral: Negative for dysphoric mood. The patient is not nervous/anxious.         Objective:   Physical Exam  Constitutional: He appears well-developed and well-nourished. No distress.       Frail appearing elderly gentleman in wheelchair  HENT:  Head: Normocephalic and atraumatic.  Mouth/Throat: Oropharynx is clear and moist.  Eyes: Conjunctivae normal and EOM are normal. Pupils are equal, round, and reactive to light.  Neck: Normal range of motion. Neck supple. No JVD present.  Cardiovascular: Normal rate and regular rhythm.   Murmur heard. Pulmonary/Chest: Effort normal and breath sounds normal. No respiratory distress. He has no wheezes. He has no rales.       Dry sounding crackles at bilateral bases   Abdominal: Soft. Bowel sounds are normal. He exhibits no distension. There is no tenderness.  Musculoskeletal: He exhibits edema. He exhibits no tenderness.       R leg- no palpable cords, neg homann's sign , no mass/ redness or warmth Ecchymosis noted   Lymphadenopathy:    He has no cervical adenopathy.  Neurological: He is alert. He has normal reflexes.  Skin: Skin is warm and dry. No rash noted. No erythema. No pallor.       Diffuse ecchymosis over R buttock and extending down R leg- this appears to be old and resolving No M detected and no tenderness  Psychiatric: He has a normal mood and affect.          Assessment & Plan:

## 2012-09-13 NOTE — Patient Instructions (Addendum)
Labs today for bruising  Your exam is reassuring today - but if you develop increased swelling in leg or redness or pain - please call or seek care  Also if you start to bruise in other areas or bleed very easily let us know

## 2012-09-18 ENCOUNTER — Telehealth: Payer: Self-pay

## 2012-09-18 ENCOUNTER — Other Ambulatory Visit: Payer: Self-pay | Admitting: *Deleted

## 2012-09-18 MED ORDER — SENNA-DOCUSATE SODIUM 8.6-50 MG PO TABS: ORAL_TABLET | ORAL | Status: AC

## 2012-09-18 NOTE — Telephone Encounter (Signed)
noted 

## 2012-09-18 NOTE — Telephone Encounter (Signed)
Sherry with [facility] left v/m; pt fell 09/17/12 with no apparent injury; just notifying Dr Alphonsus Sias.

## 2012-09-19 ENCOUNTER — Encounter: Payer: Self-pay | Admitting: Pulmonary Disease

## 2012-09-19 ENCOUNTER — Ambulatory Visit (INDEPENDENT_AMBULATORY_CARE_PROVIDER_SITE_OTHER): Payer: Medicare Other | Admitting: Pulmonary Disease

## 2012-09-19 VITALS — BP 114/70 | HR 107 | Temp 97.7°F | Wt 195.0 lb

## 2012-09-19 DIAGNOSIS — J9611 Chronic respiratory failure with hypoxia: Secondary | ICD-10-CM

## 2012-09-19 DIAGNOSIS — J029 Acute pharyngitis, unspecified: Secondary | ICD-10-CM

## 2012-09-19 DIAGNOSIS — J9610 Chronic respiratory failure, unspecified whether with hypoxia or hypercapnia: Secondary | ICD-10-CM

## 2012-09-19 MED ORDER — MAGIC MOUTHWASH W/LIDOCAINE: 5.0000 mL | Freq: Three times a day (TID) | ORAL | Status: AC | PRN

## 2012-09-19 NOTE — Assessment & Plan Note (Signed)
Lula's dyspnea is progressing predictably, but seems to be fairly well controlled with morphine.  Plan: -continue oxygen as written -continue morphine

## 2012-09-19 NOTE — Patient Instructions (Signed)
Use the magic mouthwash as needed for the sore throat. Use saline rinses for the sinus congestion Keep taking your other medications as written We will see you back in 6 weeks or sooner if needed

## 2012-09-19 NOTE — Progress Notes (Signed)
Subjective:    Patient ID: Brett [name], male    DOB: 30-Sep-1934, 78 y.o.   MRN: [id number]  Synopsis: Brett [name] is a very pleasant male with diffuse parenchymal lung disease who has been followed by [name] since 2008.  At that time he was seen by Dr. [name] for shortness of breath and a dry cough.  He was seen by Dr. [name] who felt that his lung disease was due to chronic aspiration.  At that time a spartan serologic work up was negative.  Between 2008 and 2011 he was able to walk 3-4 miles a day. He went on oxygen in 2011.  In 2012 his exercise tolerance began to decline and his aspiration symptoms were worse.  He underwent an esophageal balloon dilation in 10/2011.  He was hospitalized for pneumonia and volume overload in March 2013. He was diuresed, given antibiotics and treated with prednisone for several weeks.  After he came off the prednisone he developed bronchitis and was hospitalized again in May with volume overload.  He was treated with diuresis and was started on Revatio for pulmonary hypertension.  Mar 07 2012 6 MW 790 feet, O2 saturation 95% at exercise on 55% VM.  In summary, he has been in a rapid state of decline since mid 2012 and it is unclear if prednisone was helpful.  HPI  09/29/11 2 week return visit:  He returns today for follow up on his diffuse parenchymal lung disease. He has undergone a barium swallow and a repeat CT scan, see below. He has had no change in his cough and shortness of breath. He is not exercising.  He also notes left lower quadrant cramping abdominal pain for one day. No fever, chills, nausea, vomiting, diarrhea. He says that this feels like his prior episodes of diverticulitis.  11/26/11 ROV -- Brett [name] returns for a follow up on his pulmonary fibrosis believed to be due to aspiration.  He underwent a balloon dilation of his esophagus this week after the esophogram showed a distal stricture.  I don't have the report from this yet. He is  participating in pulmonary rehab and feels like it is going well.  He states that his breathing is about the same as when I saw him before.  He has lost a few pounds intentionally. His cough is at baseline.  He is planning a trip to [place] soon.  01/06/12 ROV-- Brett [name] was recently hosptialized after his trip to [place].  He developed a cough with sputum production and shortness of breath.  He was diagnosed with pneumonia, fluid overload, and apparently liver dysfunction (I do not have a discharge summary).  Apparently he stayed in the ICU for several days on high flow oxygen and was treated with antibiotics, steroids, and diuretics  He was discharged with new prescriptions for Advair and Spiriva and 30mg  daily of prednisone.  His cough persists but he does not produce sputum.  He feels better but is clearly weaker than prior to his hospitalization.  He has started working in pulmonary rehab again at [facility].  His oxygen requirements are now higher at 6L/min  4/15 ROV -- Brett [name] is feeling great and states that his breathing is doing OK. Since his last visit with [place] he has tapered the prednisone, in fact he stopped prednisone today.  He has no cough and states that his exercise tolerance has improved since hospital discharge.  He stopped diuresis regularly on 3/28 when his weight was 188 lbs at [name]  office.  His weight has drifted up somewhat since then (he says his daily bathroom weight is ~193lbs.)   He is in good spirits.  He needs to have a skin cancer removed from his left ear.  It has been a few weeks since he has been in pulmonary rehab.  03/20/2012 ROV -- Brett [name] feels that he has been doing well since his last hospitalization and starting on the revatio. His cough is doing well, his swelling is at baseline and his weight is stable. He is planning to follow up with [name] office next week. He thinks that his exercise tolerance is improving as he notes that his oxygenation has been  "bouncing up above 90" faster recently.  He has not had fevers, chills, or chest pain.      05/08/2012 ROV --Since his last visit  Brett [name] has been the [facility] clinic and saw [name] who feels that the pattern of his interstitial lung disease is most consistent with UIP. Surprisingly repeated lab work performed at [facility] was markedly different than we had seen here at our center in that he had a very high ANA titer at 1: 2560. Based on this and his esophageal symptoms as well as the proximal muscle weakness 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 Since that visit Brett [name] has had improved shortness of breath and no cough. He denies fevers chills or chest pain. His leg swelling has markedly improved and overall he feels well. He continues to take the prednisone Imuran and Bactrim as written by [name]. He is concerned about weight loss and states that his appetite has been poor. He is planning to start pulmonary rehabilitation again on Wednesday of this week.  06/12/2012 ROV -- Brett [name] was hospitalized again 2 weeks ago this time in the setting of confusion and hypotension. It is believed that he overdosed on the sildenafil therapy. According to he and his family this was an unintentional overdose and was believed to be due to the confusion that he's been suffering from for the past several weeks. His family states that he has not taken Xanax at all for the last 4 days and that his confusion is improved greatly since this. He states that his breathing has "been never better" and overall he feels well. He is currently taking prednisone 20 mg daily. His cough is minimal and he has not had chest pain, fever, or chills. Physical therapy and a home health nurse are coming out to his house.  September 6,2013 ROV--Since the last visit Brett [name] has not been  hospitalized thankfully but he has started on hospice at home. He has a nurse coming twice a week and a physical therapist coming twice a week. According to his wife his confusion persists despite the fact that they try to cut back on many of his medications. He is still having difficulty with administering medications correctly despite the fact that the nurse and his wife try to help him with this he sometimes takes too many of some medications accidentally. He says it is not feeling short of breath unless he takes a "long walk" in the house to the bathroom. He wants to continue exercising and to still get out and see his friends if possible.  07/18/2012 ROV -- Brett [name] comes to clinic today for a followup visit. He states he has been more short of  breath with minimal exertion around the house. He is tearful in clinic today reflecting on the fact that this is probably the last holiday season that he will spend with his friends and family. Apparently they had very nice service for him at his church on Sunday which made quite emotional. He has not had increased cough swelling or chest pain. He actually seems a little bit more clear in regards to his mental status in clinic today.  09/19/2012 ROV -- Brett [name] comes to clinic today for followup visit. His shortness of breath has been progressing to the point to where he is having to stay in the house most of the time. Even was showering now he gets some shortness of breath and occasionally he'll get water in his oxygen tubing that makes him much more short of breath. His cough is stable and the morphine is helping for shortness of breath. He is using it about every 2 hours. In the last 24 hours she's had a sore throat and some sinus congestion. He feels a burning sensation in the back of his throat.  Past Medical History  Diagnosis Date  . Allergy   . Hx of colonic polyps   . Diverticulitis   . Gout   . Hypertension   . Arthritis   . Hyperlipidemia   . GERD  (gastroesophageal reflux disease)   . Pulmonary fibrosis   . Aortic stenosis   . Carotid stenosis   . Cataract   . Anxiety   . Shortness of breath   . Cancer     skin ca on head & shoulder     Review of Systems  Constitutional: Positive for fatigue. Negative for fever, chills and unexpected weight change.  Respiratory: Positive for shortness of breath. Negative for cough, choking, chest tightness and wheezing.   Cardiovascular: Negative for chest pain, palpitations and leg swelling.   Objective:   Physical Exam   There were no vitals filed for this visit.4 L [place] at rest  Gen: chronically ill white male in good spirits and no acute distress HEENT: NCAT, PERRL, EOMi,  PULM: Insp crackles in bases only, otherwise clear to auscultation CV: RRR, no mgr, no JVD Ext: warm, no edema noted, notable for clubbing, no cyanosis Neuro: A&Ox4, MAEW   Full PFT's from 07/2010: FEV1 1.84, (63%), Raio 86%, TLC 7.07 (105%), DLCO 41%  CT Thorax from 09/2011 showed progressive fibrosis compared to the 2008 study.   Radiology felt that he had traction bronchiectasis and honeycombing in the bases and periphery which radiology felt was due to UIP.  I think that he has clear bronchiectasis and interstitial thickening but the honeycombing is less discreet.  I am uncertain if this represents a UIP pattern.  Mar 07 2012 6 MW 790 feet, O2 saturation 95% at exercise on 55% VM    Assessment & Plan:   Chronic hypoxemic respiratory failure Brentlee's dyspnea is progressing predictably, but seems to be fairly well controlled with morphine.  Plan: -continue oxygen as written -continue morphine  Pharyngitis I saw nothing on exam to suggest a bacterial infection or any other specific pathology for that matter.  Presumably this is a viral URI.  Plan: -salt water gargles at home -magic mouthwash to help with pain -saline rinses for sinus congestion    Updated Medication List Outpatient Encounter  Prescriptions as of 09/19/2012  Medication Sig Dispense Refill  . ALPRAZolam (XANAX) 0.5 MG tablet       . aspirin 81 MG tablet Take  81 mg by mouth daily.        . Bisacodyl (DULCOLAX PO) Take by mouth 2 (two) times daily.      . cetirizine (ZYRTEC) 10 MG tablet Take 10 mg by mouth at bedtime.       . Cholecalciferol (VITAMIN D3) 1000 UNITS CAPS Take by mouth daily.       . colchicine 0.6 MG tablet Take 1 tablet (0.6 mg total) by mouth daily.  30 tablet  5  . furosemide (LASIX) 20 MG tablet Take 2 tablets (40 mg total) by mouth daily.  60 tablet  1  . morphine (ROXANOL) 20 MG/ML concentrated solution Take 0.25-0.5 mLs (5-10 mg total) by mouth every 2 (two) hours as needed (dyspnea).  30 mL  0  . Multiple Vitamin (MULTIVITAMIN) capsule Take 1 capsule by mouth daily.        Marland Kitchen omeprazole (PRILOSEC) 20 MG capsule Take 20 mg by mouth 2 (two) times daily.      . pravastatin (PRAVACHOL) 40 MG tablet Take 40 mg by mouth daily.      . sennosides-docusate sodium (SENOKOT-S) 8.6-50 MG tablet Take 2 tablets by mouth twice daily for constipation.  120 tablet  6  . sulfamethoxazole-trimethoprim (BACTRIM DS) 800-160 MG per tablet 1 tablet every Monday, Wednesday, and Friday      . traMADol (ULTRAM) 50 MG tablet Take 1 tablet (50 mg total) by mouth every 8 (eight) hours as needed for pain.  90 tablet  0

## 2012-09-19 NOTE — Assessment & Plan Note (Signed)
I saw nothing on exam to suggest a bacterial infection or any other specific pathology for that matter.  Presumably this is a viral URI.  Plan: -salt water gargles at home -magic mouthwash to help with pain -saline rinses for sinus congestion

## 2012-09-28 ENCOUNTER — Telehealth: Payer: Self-pay

## 2012-09-28 DIAGNOSIS — J8410 Pulmonary fibrosis, unspecified: Secondary | ICD-10-CM

## 2012-09-28 DIAGNOSIS — R0902 Hypoxemia: Secondary | ICD-10-CM

## 2012-09-28 NOTE — Telephone Encounter (Signed)
Last potassium in August was not low It is not easy to take Have her draw met b to check  I am planning to make home visit on 12/18 afternoon I will call her with time when I know when it will be

## 2012-09-28 NOTE — Telephone Encounter (Signed)
[facility] [place] for one week pt had episodes of light headedness and felt like going to faint(pt did not lose consciousness) Pt has hx of irregular heart beat and that has not changed. Pt takes Lasix 80 mg daily; Misty Stanley wanted to know if could add a potassium supplement to see if help symptoms. No pharmacy left if K prescribed call Misty Stanley.Please advise.

## 2012-09-29 NOTE — Telephone Encounter (Signed)
Yes she will add uric acid and draw labs on Monday.

## 2012-09-29 NOTE — Telephone Encounter (Signed)
It is okay to take the colchicine bid Anymore is likely to cause sig GI upset See if she can add uric acid to the blood work as well

## 2012-09-29 NOTE — Telephone Encounter (Signed)
Spoke with hospice nurse and advised results, she would like to know if patient can double up on his colchine? Pt takes this everyday and is having a gout flare up. Please advise

## 2012-10-03 ENCOUNTER — Encounter: Payer: Self-pay | Admitting: Internal Medicine

## 2012-10-04 ENCOUNTER — Ambulatory Visit: Payer: Medicare Other | Admitting: Internal Medicine

## 2012-10-04 ENCOUNTER — Encounter: Payer: Self-pay | Admitting: Internal Medicine

## 2012-10-04 VITALS — BP 90/60 | HR 76 | Temp 97.6°F | Resp 24 | Wt 192.6 lb

## 2012-10-04 DIAGNOSIS — M109 Gout, unspecified: Secondary | ICD-10-CM

## 2012-10-04 DIAGNOSIS — J9610 Chronic respiratory failure, unspecified whether with hypoxia or hypercapnia: Secondary | ICD-10-CM

## 2012-10-04 DIAGNOSIS — K219 Gastro-esophageal reflux disease without esophagitis: Secondary | ICD-10-CM

## 2012-10-04 DIAGNOSIS — F411 Generalized anxiety disorder: Secondary | ICD-10-CM

## 2012-10-04 DIAGNOSIS — G589 Mononeuropathy, unspecified: Secondary | ICD-10-CM

## 2012-10-04 DIAGNOSIS — J9611 Chronic respiratory failure with hypoxia: Secondary | ICD-10-CM

## 2012-10-04 DIAGNOSIS — F419 Anxiety disorder, unspecified: Secondary | ICD-10-CM

## 2012-10-04 DIAGNOSIS — J8410 Pulmonary fibrosis, unspecified: Secondary | ICD-10-CM

## 2012-10-04 NOTE — Assessment & Plan Note (Signed)
With chronic hypoxic resp failure On chronic oxygen Marked decompensation if off Hospice care Mild progression in the past couple of months

## 2012-10-04 NOTE — Assessment & Plan Note (Signed)
Will continue the lasix to keep his weight down

## 2012-10-04 NOTE — Assessment & Plan Note (Signed)
quiet on the omeprazole Will continue since I don't want anything that could compromise his resp function

## 2012-10-04 NOTE — Assessment & Plan Note (Signed)
mostly just numbness No meds since pain is not much of an issue

## 2012-10-04 NOTE — Assessment & Plan Note (Signed)
Exacerbation in great toes Quiet now Will have the colchicine for prn If freq recurrence, will consider allopurinol

## 2012-10-04 NOTE — Assessment & Plan Note (Signed)
Alprazolam only at bedtime

## 2012-10-04 NOTE — Progress Notes (Signed)
Subjective:    Patient ID: Brett [name], male    DOB: 13-Nov-1933, 78 y.o.   MRN: [id number]  HPI Wife and Misty Stanley, hospice RN are here  Ongoing symptomatic dyspnea Trouble walking in home or doing any personal care Sits at the sink Has aide to help with shower 3 times per week Now using the roxanol more regularly for dyspnea  Weight is down some from max near 200# Using the furosemide bid Only uses support socks when aide is in--he can't get them on  Notes more numbness in feet from the neuropathy Will have stiffness in legs and some pain upon first arising  Had gouty attack in left>right great toe Did improve with bid colchicine Uric acid was over 9  Nerves have been okay Uses 1/2 alprazolam just for bedtime  Uses omeprazole for stomach Controls heartburn  Current Outpatient Prescriptions on File Prior to Visit  Medication Sig Dispense Refill  . ALPRAZolam (XANAX) 0.5 MG tablet Take 0.25 mg by mouth at bedtime as needed.       . Alum & Mag Hydroxide-Simeth (MAGIC MOUTHWASH W/LIDOCAINE) SOLN Take 5 mLs by mouth 3 (three) times daily as needed (for mouth/throat pain).  200 mL  1  . aspirin 81 MG tablet Take 81 mg by mouth daily.        . cetirizine (ZYRTEC) 10 MG tablet Take 10 mg by mouth at bedtime.       . Cholecalciferol (VITAMIN D3) 1000 UNITS CAPS Take by mouth daily.       Marland Kitchen morphine (ROXANOL) 20 MG/ML concentrated solution Take 0.25-0.5 mLs (5-10 mg total) by mouth every 2 (two) hours as needed (dyspnea).  30 mL  0  . Multiple Vitamin (MULTIVITAMIN) capsule Take 1 capsule by mouth daily.        Marland Kitchen omeprazole (PRILOSEC) 20 MG capsule Take 20 mg by mouth 2 (two) times daily.      . sennosides-docusate sodium (SENOKOT-S) 8.6-50 MG tablet Take 2 tablets by mouth twice daily for constipation.  120 tablet  6  . traMADol (ULTRAM) 50 MG tablet Take 1 tablet (50 mg total) by mouth every 8 (eight) hours as needed for pain.  90 tablet  0  . [DISCONTINUED] famotidine (PEPCID) 20 MG  tablet Take 20 mg by mouth at bedtime.          Allergies  Allergen Reactions  . Penicillins   . Shrimp (Shellfish Allergy)   . Sulfonamide Derivatives     REACTION: itching    Past Medical History  Diagnosis Date  . Allergy   . Hx of colonic polyps   . Diverticulitis   . Gout   . Hypertension   . Arthritis   . Hyperlipidemia   . GERD (gastroesophageal reflux disease)   . Pulmonary fibrosis   . Aortic stenosis   . Carotid stenosis   . Cataract   . Anxiety   . Shortness of breath   . Cancer     skin ca on head & shoulder    Past Surgical History  Procedure Date  . Anomalous pulmonary venous return repair   . Cardiac catheterization   . Melanoma excision     shoulder    Family History  Problem Relation Age of Onset  . Diabetes Mother   . Cancer Sister     breast  . Diabetes Maternal Grandmother     History   Social History  . Marital Status: Married    Spouse Name: N/A  Number of Children: 1  . Years of Education: N/A   Occupational History  . retired Proofreader    Social History Main Topics  . Smoking status: Former Smoker -- 1.0 packs/day for 5 years    Types: Cigarettes    Quit date: 10/19/1963  . Smokeless tobacco: Never Used     Comment: quit in the 60's  . Alcohol Use: Yes     Comment: rarely  . Drug Use: No  . Sexually Active: Not Currently   Other Topics Concern  . Not on file   Social History Narrative   Retired Engineer, site.  No mold in home, no cedar in or around home.  No exposure to chemicals, dusts, asbestos, pesticides.  Smoked very briefly (<4 years) in teenage years.     Review of Systems Has support from church--this has helped him spiritually Appetite is okay Bowels have been slow---with the morphine     Objective:   Physical Exam  Constitutional: He appears well-developed and well-nourished. No distress.  Neck: Normal range of motion. Neck supple.  Cardiovascular: Normal rate and normal heart  sounds.  Exam reveals no gallop.   No murmur heard.      irregular  Pulmonary/Chest: Effort normal. No respiratory distress. He has no wheezes. He has rales.       Bibasilar fine crackles  Abdominal: Soft. There is no tenderness.  Musculoskeletal: He exhibits edema. He exhibits no tenderness.       Trace edema  Lymphadenopathy:    He has no cervical adenopathy.  Psychiatric: He has a normal mood and affect. His behavior is normal.       Upbeat today          Assessment & Plan:

## 2012-10-10 ENCOUNTER — Other Ambulatory Visit: Payer: Self-pay | Admitting: *Deleted

## 2012-10-10 MED ORDER — COLCHICINE 0.6 MG PO TABS: 0.6000 mg | ORAL_TABLET | Freq: Two times a day (BID) | ORAL | Status: AC | PRN

## 2012-10-10 NOTE — Telephone Encounter (Signed)
rx sent electronically. 

## 2012-10-10 NOTE — Telephone Encounter (Signed)
Is this ok to fill? I did not see that you had previously filled it.

## 2012-10-10 NOTE — Telephone Encounter (Signed)
Okay #[id number] x 11 

## 2012-10-13 ENCOUNTER — Telehealth: Payer: Self-pay | Admitting: Internal Medicine

## 2012-10-13 NOTE — Telephone Encounter (Signed)
[address] [place], [place] [address] p. [phone] f. [phone] To: [facility] (After Hours Triage) Fax: [phone] From: [facility] Date/ Time: 10/12/2012 5:08 PM Taken By: Jethro BolusAurther Loft Facility: home Patient: Brett [name], Brett [name] DOB: June 11, 1934 Phone: [phone] Reason for Call: Patient wants to make Dr. Alphonsus Sias aware that he is going to be going to the Dentist for tooth cleaning. He will be in the waiting room for a short period of time. He has Pulmonary Fibrosis. He is just wanting to let Dr. Alphonsus Sias know that he has alerted the Dentist office o his health concerns and they are not going to make him wait in the lobby. HE STATES HE UPDATING DR.LETVAK Regarding Appointment: Appt Date: Appt Time: Unknown Provider: Reason:

## 2012-10-13 NOTE — Telephone Encounter (Signed)
That is a good plan We don't want him exposed to anyone with a respiratory illness

## 2012-10-16 ENCOUNTER — Other Ambulatory Visit: Payer: Self-pay | Admitting: *Deleted

## 2012-10-16 MED ORDER — FUROSEMIDE 20 MG PO TABS: 40.0000 mg | ORAL_TABLET | Freq: Two times a day (BID) | ORAL | Status: AC

## 2012-10-27 ENCOUNTER — Telehealth: Payer: Self-pay | Admitting: Internal Medicine

## 2012-10-27 ENCOUNTER — Telehealth: Payer: Self-pay

## 2012-10-27 NOTE — Telephone Encounter (Signed)
Spoke with hospice nurse and advised results  

## 2012-10-27 NOTE — Telephone Encounter (Signed)
Caller: Misty Stanley RN/; Phone: [phone]; Reason for Call: Patient under Hospice Care for Pulmonary Fibrosis.  Patient is retaining fluid again.  He is having abdominal distention.  On lasix 40mg  bid.  Do you want to increase tid?  2+pitting from top of leg into the feet.  Swelling in suprapubic area which is new.   Tami Ribas RN- Hospice

## 2012-10-27 NOTE — Telephone Encounter (Signed)
Please call Brett [name] If the 40mg  isn't working, we need to increase the dose Have him take 80mg  in the morning and the second dose of 80mg  can be prn (like go down to just 80 daily when the weight is back down again)

## 2012-10-27 NOTE — Telephone Encounter (Signed)
Brett [name] with [facility] left message to return call; left v/m for Misty Stanley to return call.

## 2012-10-27 NOTE — Telephone Encounter (Signed)
Misty Stanley called back and left message with [facility].

## 2012-10-31 ENCOUNTER — Ambulatory Visit: Payer: Medicare Other | Admitting: Pulmonary Disease

## 2012-11-06 ENCOUNTER — Telehealth: Payer: Self-pay | Admitting: *Deleted

## 2012-11-06 NOTE — Telephone Encounter (Signed)
Discussed with Misty Stanley the hospice nurse who is still at his home Clear deterioration ---not sure if fluid or the fibrosis Will plan home visit tomorrow afternoon around 5PM

## 2012-11-06 NOTE — Telephone Encounter (Signed)
I was calling to speak to patient about the message I got about his wife, but the nurse was there Tami Ribas and daughter and they was asking when Dr.Letvak would be out to see pt next? They are requesting an appt soon, the nurse thinks he's in congestive heart failure. Please advise

## 2012-11-07 ENCOUNTER — Telehealth: Payer: Self-pay | Admitting: Pulmonary Disease

## 2012-11-07 ENCOUNTER — Ambulatory Visit: Payer: Medicare Other | Admitting: Internal Medicine

## 2012-11-07 ENCOUNTER — Telehealth: Payer: Self-pay | Admitting: *Deleted

## 2012-11-18 NOTE — Telephone Encounter (Signed)
Front desk took a message from the hospice nurse that pt passed this morning, they didn't give a time. I will cancel the home visit you had planned for today.

## 2012-11-18 NOTE — Telephone Encounter (Signed)
Spoke to wife and passed on my condolences

## 2012-11-18 NOTE — Telephone Encounter (Signed)
Spoke with Brett [name], expressed my condolences

## 2012-11-18 DEATH — deceased

## 2013-05-15 IMAGING — CT CT CHEST W/O CM
2 of 5 series · 15 of 36 positions shown, 18 images · IV contrast (APPLIED)
Comparison: Chest CT 11/24/2006.

CLINICAL DATA: Interstitial lung disease.

CT CHEST WITHOUT CONTRAST
TECHNIQUE: Multidetector CT imaging of the chest was performed
following the standard protocol without IV contrast.

[Series 2: chest with st · axial · 0.89mm/px · z∈[-320,-54]mm · 12 of 61 slices shown, 15 images]
[im 4/61  mediastinal]
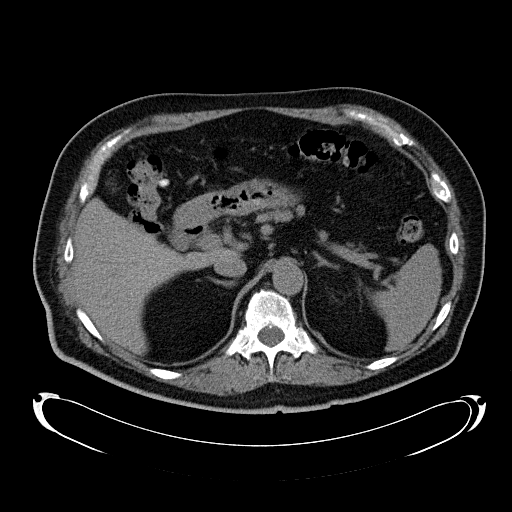
[im 4/61  lung]
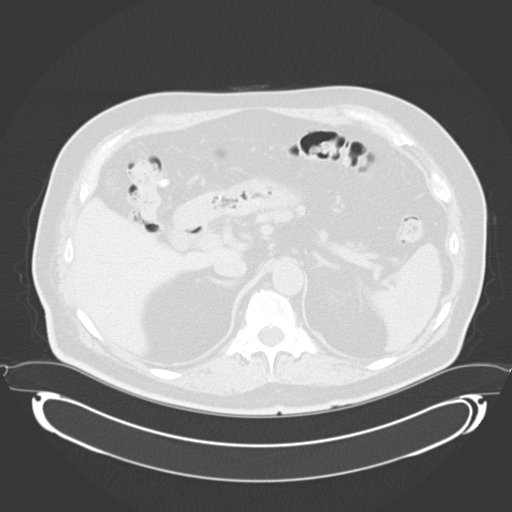
[im 11/61  lung]
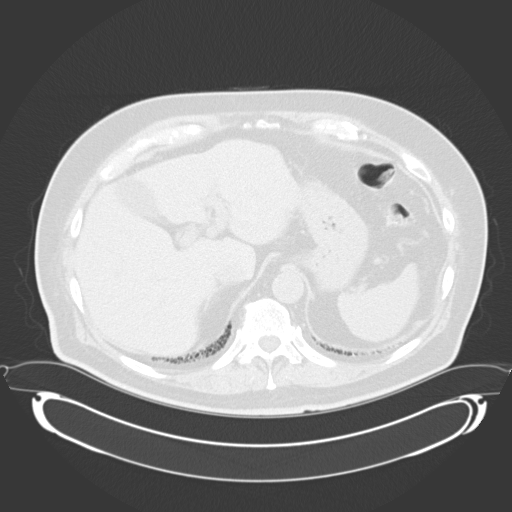
[im 15/61  lung]
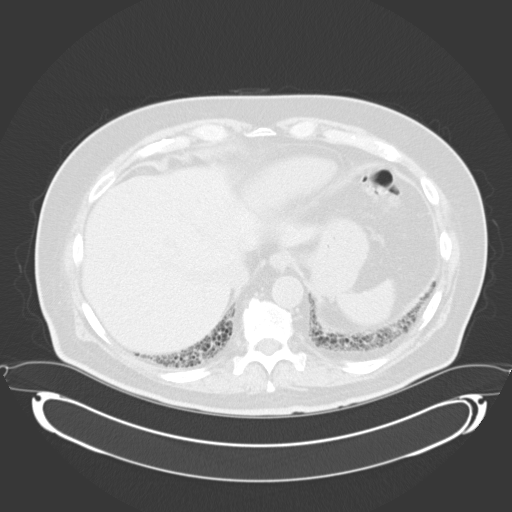
[im 18/61  lung]
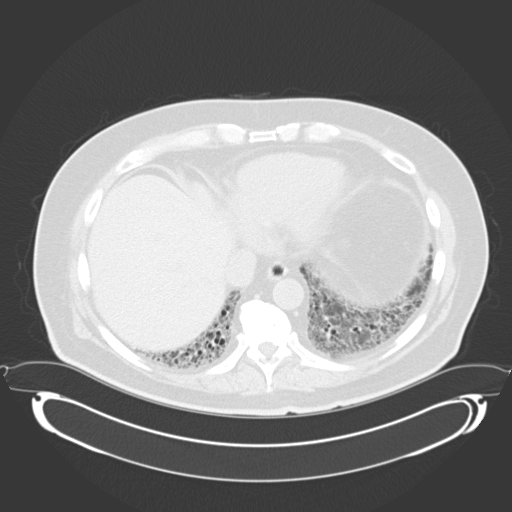
[im 25/61  mediastinal]
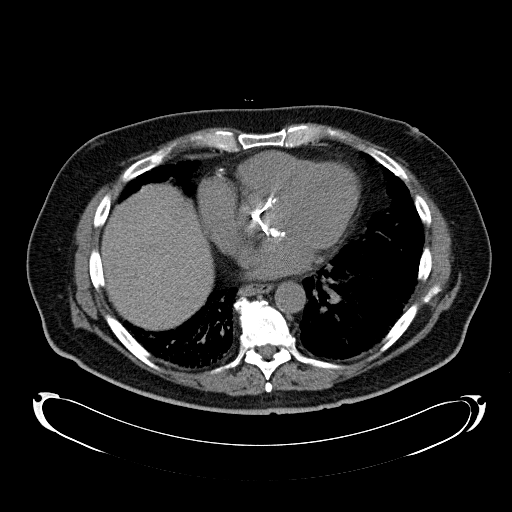
[im 25/61  lung]
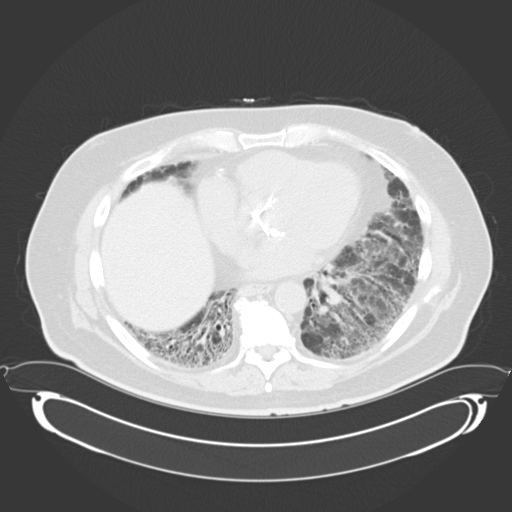
[im 29/61  lung]
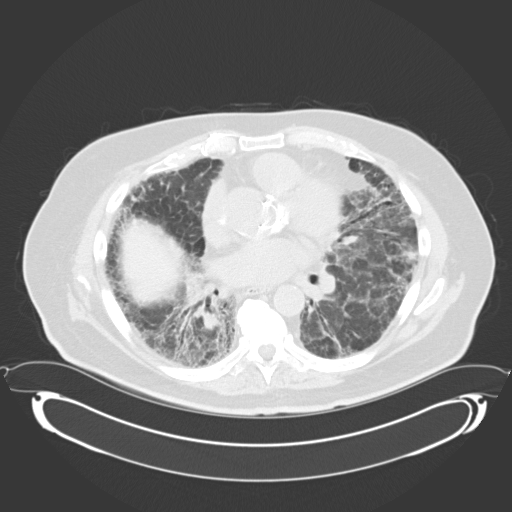
[im 32/61  lung]
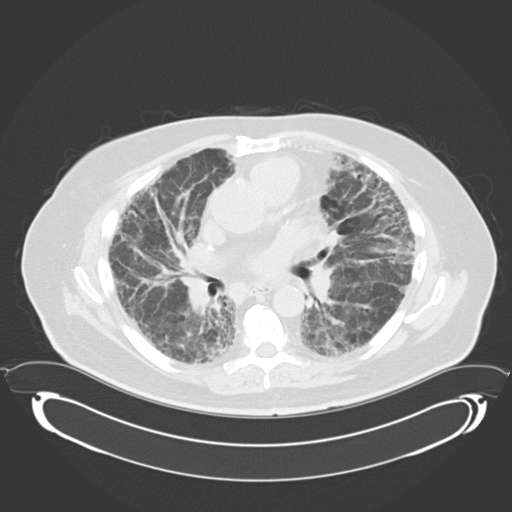
[im 39/61  lung]
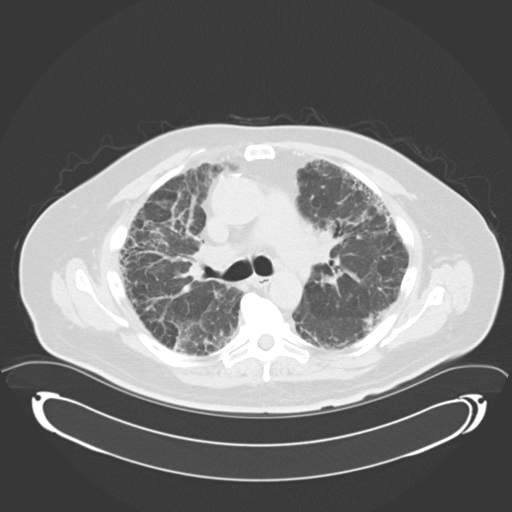
[im 43/61  mediastinal]
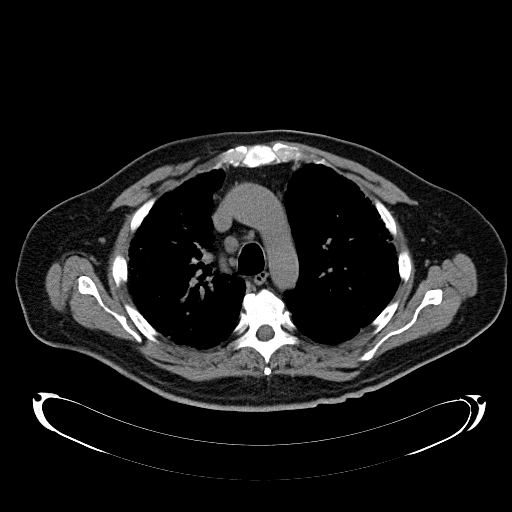
[im 43/61  lung]
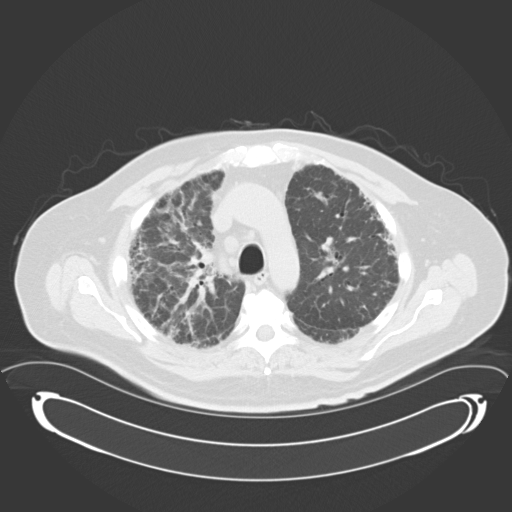
[im 46/61  lung]
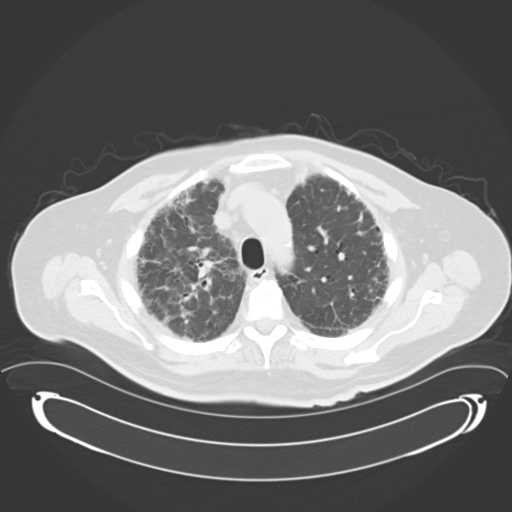
[im 53/61  lung]
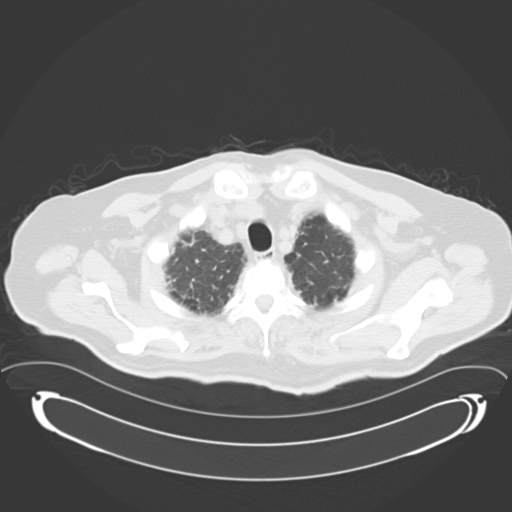
[im 57/61  lung]
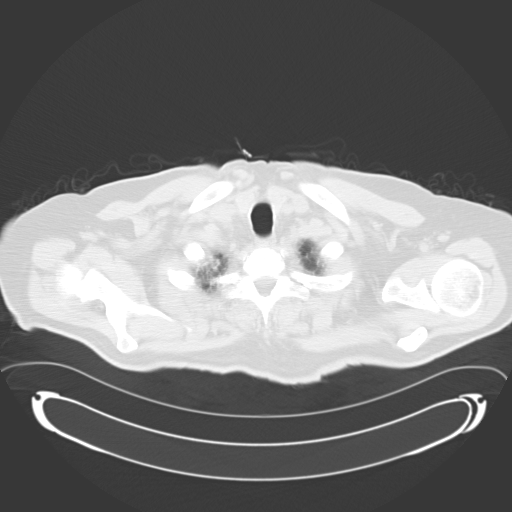

[Series 602: coronal images · coronal · 0.89mm/px · 3 of 94 slices shown]
[im 19/94  lung]
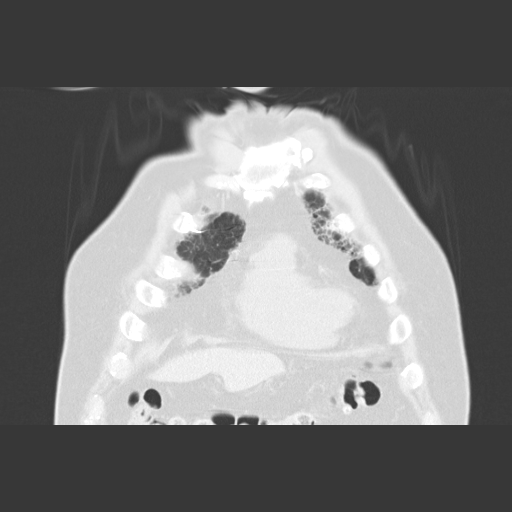
[im 38/94  lung]
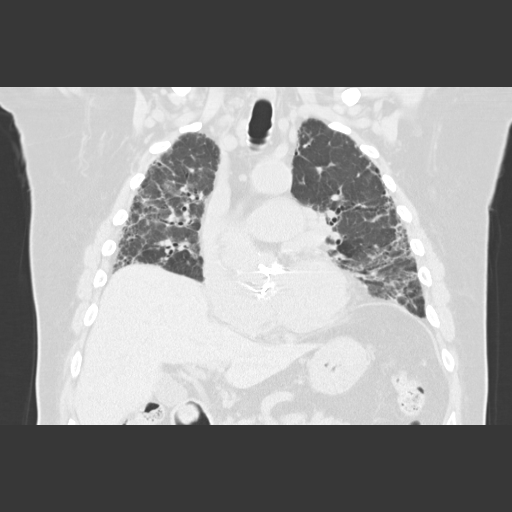
[im 56/94  lung]
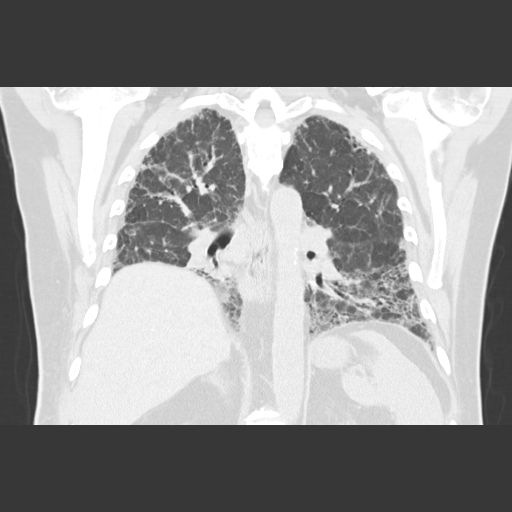

[15 of 36 positions shown; findings below may reference images not displayed]

FINDINGS: The chest wall is unremarkable.  No supraclavicular or
axillary lymphadenopathy.  The bony thorax is intact.

The heart is upper limits of normal in size and stable.  No
pericardial effusion.  Surgical changes from aortic valve
replacement surgery.  Stable fusiform enlargement of the ascending
thoracic aorta with maximal measurement of 4.5 cm.  Mild
enlargement of the main and right and left pulmonary arteries
suggesting pulmonary hypertension.  There are stable borderline
mediastinal and hilar lymph nodes.

The esophagus is grossly normal.  The descending thoracic aorta is
normal in caliber.  Coronary artery calcifications are noted.

Examination of the lung parenchyma demonstrates significant and
progressive changes of pulmonary fibrosis with peripheral
honeycombing and dense lower lobe scarring changes.  There is
associated traction bronchiectasis.  No superimposed acute process
such as edema or infiltrate.  No worrisome mass lesions.  The
tracheobronchial tree centrally is grossly normal.

The upper abdomen is unremarkable.
IMPRESSION: 1.  Progressive interstitial pulmonary fibrosis (likely UIP) with
peripheral honeycombing and traction bronchiectasis.
2.  Stable fusiform enlargement of the ascending thoracic aorta.
3.  Stable surgical changes from aortic valve replacement surgery.

## 2014-01-05 IMAGING — CR DG CHEST 2V
2 series · 2 of 2 positions shown · non-contrast
Comparison: 01/07/2012

CLINICAL DATA: Pulmonary fibrosis, follow up infiltrates

CHEST - 2 VIEW

[view not recorded (1 of 2)]
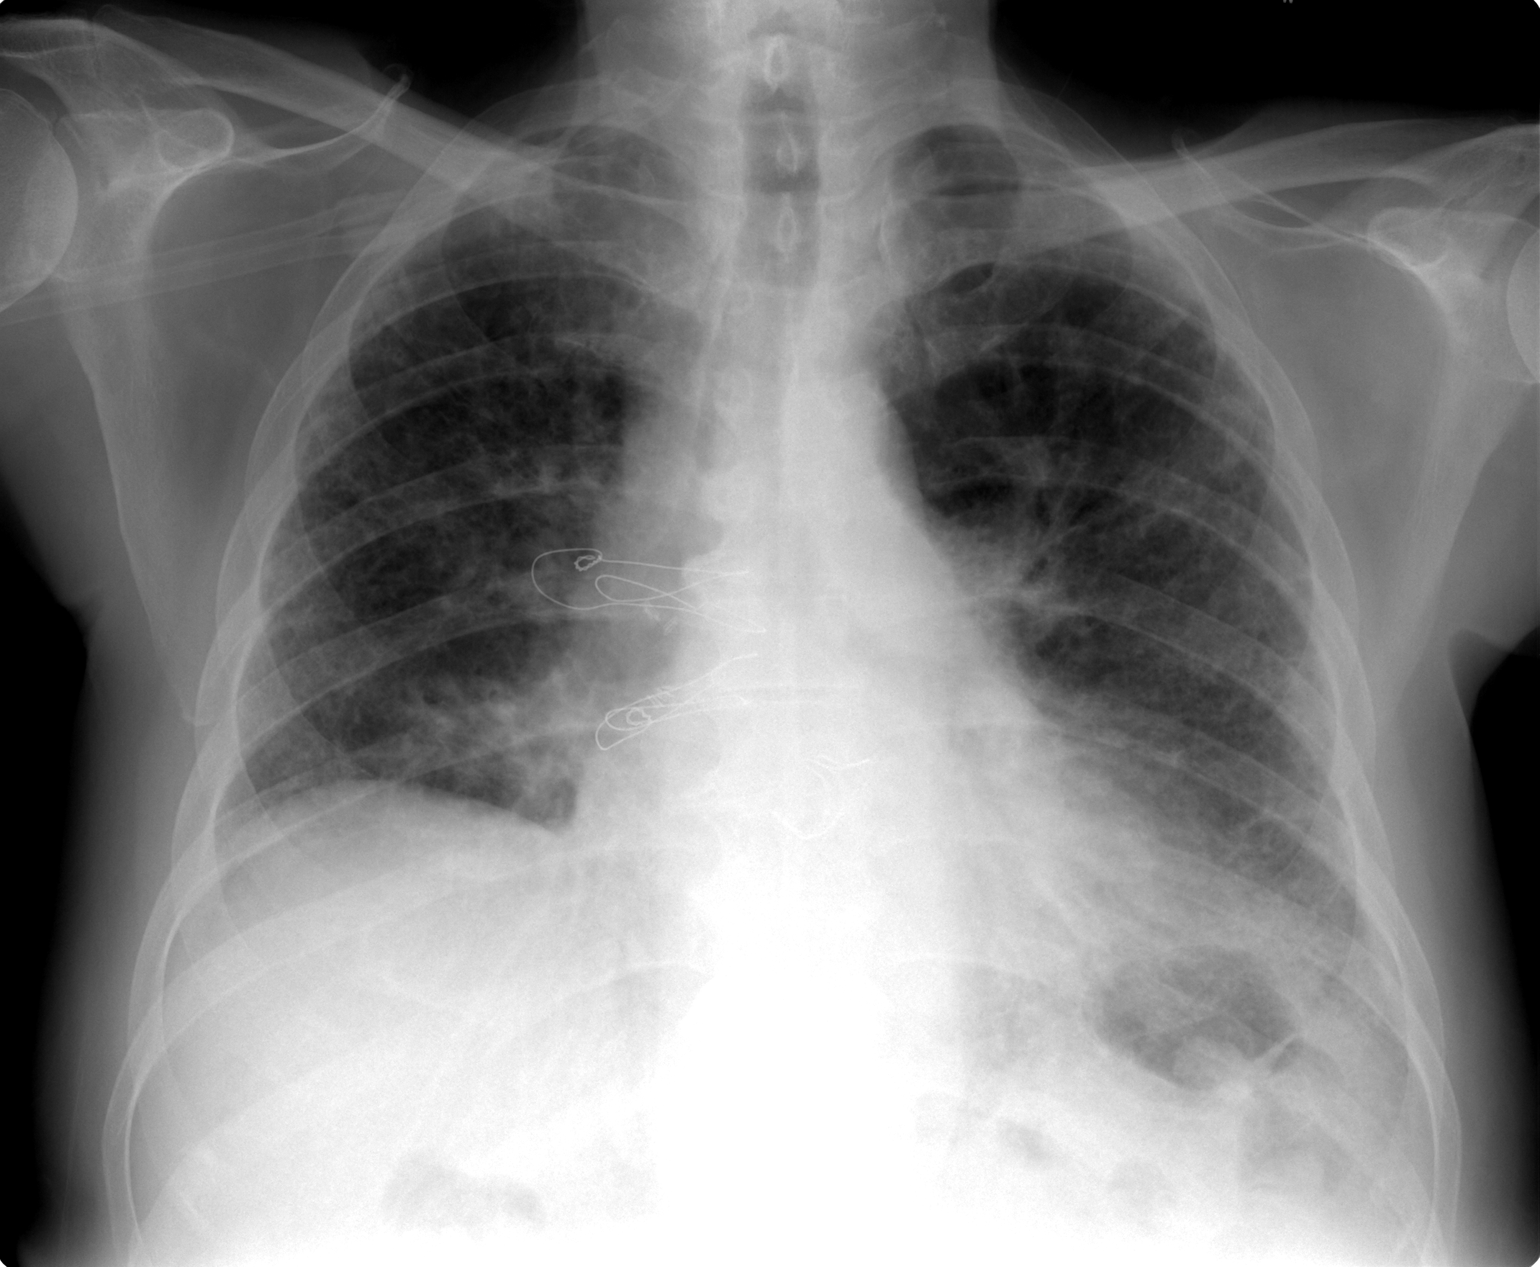

[view not recorded (2 of 2)]
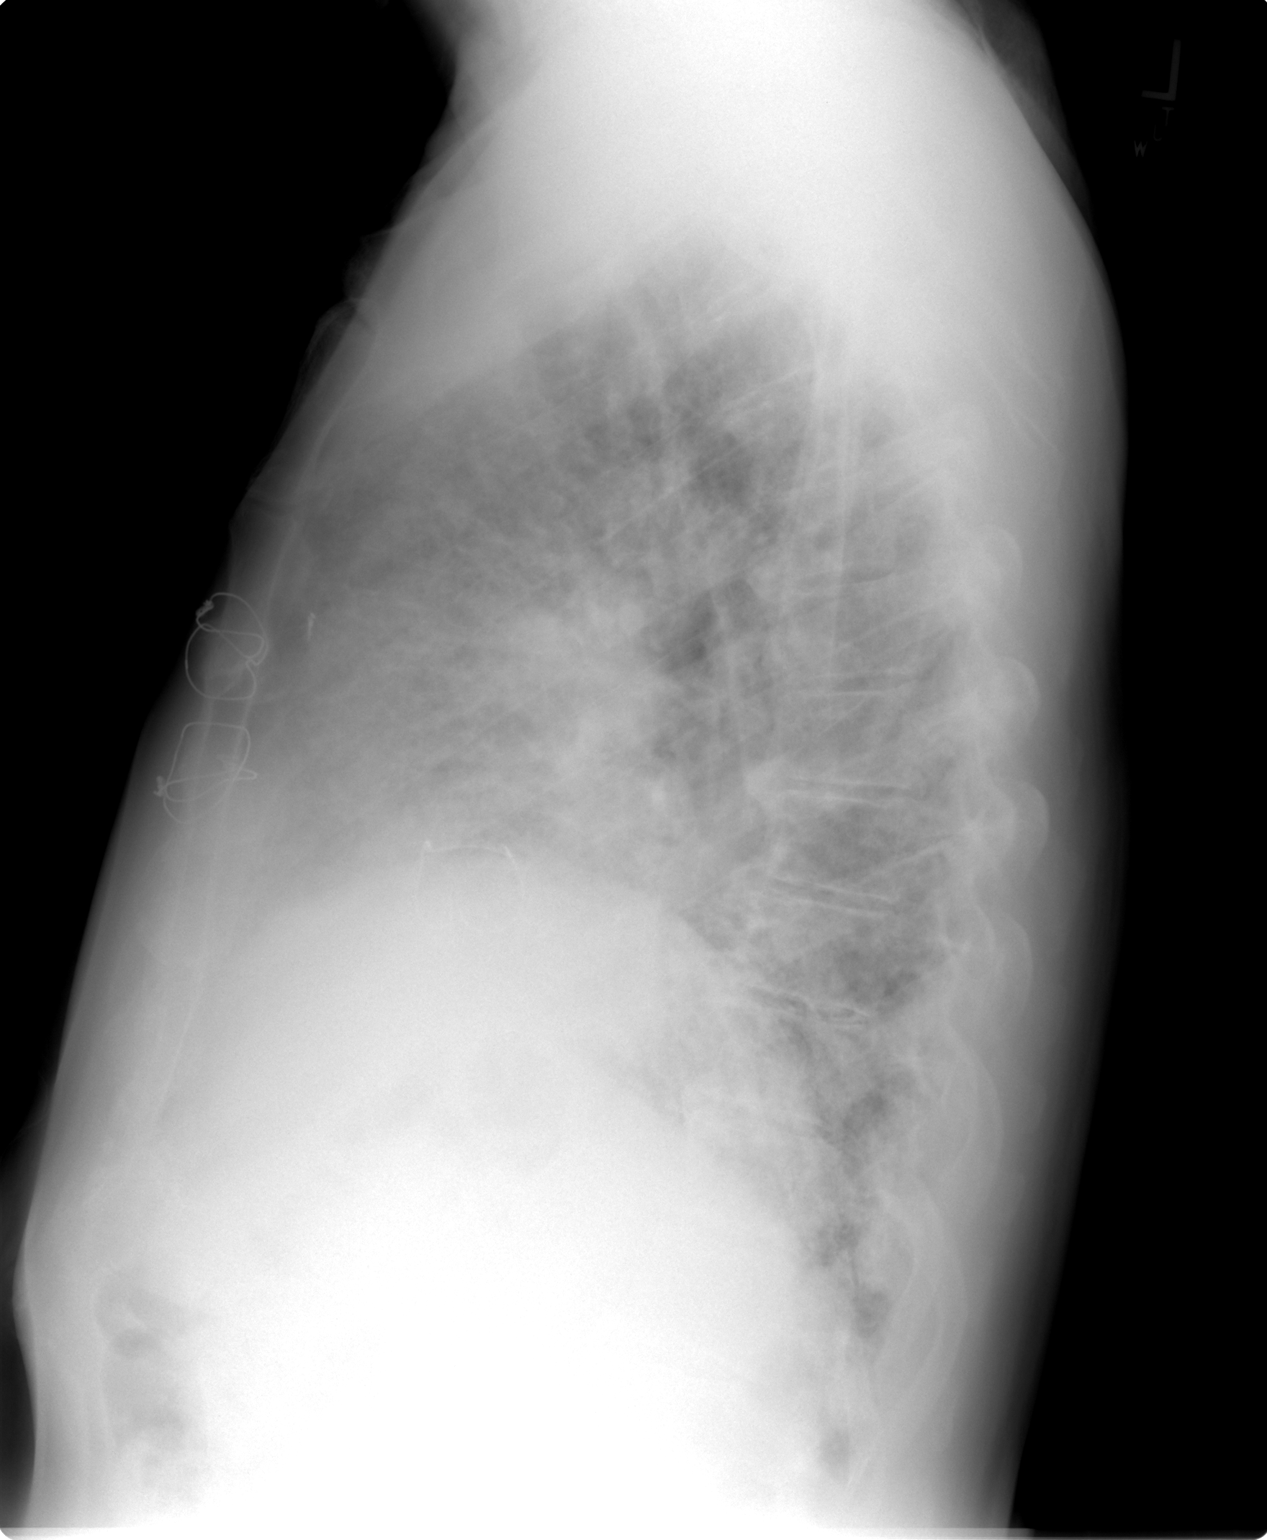

[2 of 2 positions shown; findings below may reference images not displayed]

FINDINGS: Prior median sternotomy and AVR.
Minimal enlargement of cardiac silhouette.
Mediastinal contours normal.
Slight pulmonary vascular congestion.
Diffuse pulmonary fibrosis again identified.
Pattern appears stable since previous exam.
No definite superimposed infiltrate, effusion or obvious mass.
Scattered endplate spur formation thoracic spine.
Question osseous demineralization.
IMPRESSION: Minimal enlargement of cardiac silhouette post AVR.
Diffuse pulmonary fibrosis.
No acute abnormalities.

## 2015-02-04 NOTE — Discharge Summary (Signed)
PATIENT NAME:  Brett [name], Brett [name] MR#:  [id number] DATE OF BIRTH:  11/15/1933  DATE OF ADMISSION:  05/29/2012 DATE OF DISCHARGE:  05/30/2012  PRESENTING COMPLAINT: Hypotension and altered mental status.   DISCHARGE DIAGNOSES:  1. Acute hypotension in the setting of accidental ingestion of additional home medications.  2. Chronic pulmonary hypertension.  3. Chronic pulmonary fibrosis, on home oxygen. Sats 99% on 4 liters nasal cannula.   CODE STATUS: NO CODE, DO NOT RESUSCITATE.   MEDICATIONS:  1. Vitamin D3 1000 international units p.o. daily.  2. Etodolac 500 mg p.o. daily.  3. PreserVision 1 tablet daily.  4. Omeprazole 20 mg b.i.d.  5. Aspirin 81 mg daily.  6. Colchicine 0.6 mg daily.  7. Zyrtec 10 mg at bedtime.  8. Pravastatin 40 mg daily.  9. Revatio 20 mg 3 times a day.  10. Imuran 50 mg daily.  11. Bactrim DS 800/160 one tablet 3 times a week on Monday, Wednesday, Friday. 12. Lasix 20 mg daily.  13. Prednisone 10 mg 2 tablets daily.  14. Tramadol 50 mg daily.   DIET: Low sodium diet.   OXYGEN: 6 liters nasal cannula oxygen.   FOLLOW-UP: Follow-up with Dr. Silvio Pate, primary care physician, as needed.   LABORATORY, DIAGNOSTIC, AND RADIOLOGICAL DATA: CBC within normal limits except CO2 of 38. Magnesium 2. Hemoglobin A1c 6.3. Blood cultures negative in 8 to 12 hours.   Chest x-ray no evidence of pneumothorax.   EKG normal sinus rhythm with nonspecific ST-T abnormality.   Urinalysis negative for urinary tract infection. Ammonia was less than 25.   CT of the head showed mild atrophy. No acute intracranial abnormality.   Albumin 3.0. Cardiac enzymes x3 were negative. B-type Natriuretic Peptide 829.   CONSULTATIONS: None.   REFERRAL: Resume home health services as before.   BRIEF SUMMARY OF HOSPITAL COURSE: Brett [name] is a pleasant 81 year old gentleman with history of pulmonary hypertension, pulmonary fibrosis, and COPD on home oxygen, 6 liters, spilled his pills  from his pillbox which has been stocked for every day of the week and mixed them up apparently having accidentally taken more pills. Subsequently he presented to the Emergency Room with confusion, slurred speech, and hypotensive.  1. He was admitted with acute hypotension suspected from accidental intake of extra dose of his Revatio and Lasix. He was started on IV fluids, IV Levophed drip which was discontinued once his blood pressure remained stable. His home meds were resumed. He ambulated after breakfast with RN and did not have any trouble with it.  2. Accidental overdose of his medications. ER consulted Poison Control, recommended supportive care.  3. Chronic severe pulmonary hypertension, pulmonary fibrosis, and history of chronic respiratory failure, uses 6 liters of oxygen. Sats were 100%. The patient is back on his Revatio.  4. History of chronic diastolic heart failure, appears well compensated.  5. Gastroesophageal reflux disease. Continued PPI.  6. Gout. On colchicine.   The patient will be following up with Dr. Silvio Pate as outpatient on as needed basis. He will keep up his pulmonologist and Cardiology appointment on his scheduled dates. Hospital stay otherwise remained stable.   CODE STATUS: The patient remained a NO CODE, DO NOT RESUSCITATE.   TIME SPENT: 40 minutes.   ____________________________ Hart Rochester Posey Pronto, MD sap:drc D: 05/30/2012 14:43:50 ET T: 05/31/2012 15:34:33 ET JOB#: [id number]  cc: Shontelle Muska A. Posey Pronto, MD, <Dictator> Venia Carbon, MD Ilda Basset MD ELECTRONICALLY SIGNED 06/09/2012 12:41

## 2015-02-04 NOTE — H&P (Signed)
PATIENT NAME:  Brett, [name] MR#:  [id number] DATE OF BIRTH:  Apr 10, 1934  DATE OF ADMISSION:  05/29/2012  REFERRING PHYSICIAN: ER physician, Dr. Jasmine December PRIMARY CARE PHYSICIAN: Dr. Silvio Pate  CARDIOLOGIST: Dr. Nehemiah Massed  PULMONOLOGIST: Dr. Lake Bells with [facility]  CHIEF COMPLAINT: Possible accidental overdose on medications.   HISTORY OF PRESENT ILLNESS: Patient is a 81 year old male with past medical history of chronic respiratory failure on home oxygen 6 liters due to pulmonary fibrosis and moderate to severe pulmonary hypertension, chronic diastolic congestive heart failure, possible chronic obstructive pulmonary disease who was brought in by his wife for confusion, slurred speech. Recently patient's physician recommended that he have his medications readdressed, therefore patient's home health nurse came yesterday and restocked his weekly supply of medications in his pillbox. She found that he was taking more pills than he should be taking on a daily basis. Today when the patient opened the box to take his morning medications they were overstocked and fell on the floor. The patient mixed all his pills up. He thought he had sorted them out properly but may have taken extra doses of some of the medications. His wife who is at bedside is not able to tell me what medications they may be. Subsequently patient was acting confused and had some slurred speech therefore patient's wife got concerned and brought him to the Emergency Room where patient was found to be confused more than his usual and also hypotensive. Patient was on Revatio, Lasix for his pulmonary hypertension and there was a strong suspicion that he may have taken accidental overdose of the Revatio. He was given IV fluids in the ED and is going to be started on Levothroid drip. The ER physician called the [facility] who recommended that he be observed closely and given supportive care.   ALLERGIES: Penicillin, sulfa medications,  shellfish.   PAST MEDICAL HISTORY:  1. Pulmonary fibrosis. 2. Chronic respiratory failure on home oxygen 6 liters. 3. Moderate to severe pulmonary hypertension. Last echo was in 12/2011 which showed RVSP of 60 mmhg  4. Chronic diastolic failure. 5. Possible chronic obstructive pulmonary disease.  6. Aortic valve replacement bioprosthetic valve.  7. Gastroesophageal reflux disease. 8. Arthritis. 9. History of arrhythmias. 10. Hemorrhoids. 11. Paroxysmal atrial fibrillation.  12. Esophageal stricture status post dilatation.  50. Last admitted to [facility] 02/28/2012 for acute on chronic hypoxic respiratory failure with diastolic congestive heart failure exacerbation and pulmonary hypertension.   PAST SURGICAL HISTORY:  1. Back surgery. 2. Bioprosthetic valve surgery. 3. Microsurgery.   CURRENT MEDICATIONS:  1. Aspirin 81 mg daily.  2. Bactrim DS 800/160 mg 3 times a week on Monday, Wednesday and Friday. 3. Colchicine 0.6 mg daily.  4. Etodolac 500 mg daily.  5. Imuran 50 mg daily.  6. Lasix 20 mg daily.  7. Multivitamin 1 tablet daily.  8. Omeprazole 20 mg b.i.d.  9. Pravachol 40 mg daily.  10. Prednisone 20 mg daily.  11. PreserVision 1 tablet daily.  12. Revatio 20 mg t.i.d.  13. Tramadol 50 mg daily.  14. Vitamin B12 1000 mcg daily.  15. Vitamin D3 1000 international units once a day. 16. Zyrtec 10 mg daily.   SOCIAL HISTORY: Patient is married, lives with his wife. Normally ambulates with a cane. Quit smoking in the 1960s. Occasionally drinks alcohol. No history of drug abuse.   FAMILY HISTORY: Sister had breast cancer.   REVIEW OF SYSTEMS: Patient is lethargic and slow to respond but appropriate. CONSTITUTIONAL: Denies any fever. Has  chronic fatigue. EYES: Denies any blurred or double vision. ENT: Denies any tinnitus, ear pain. RESPIRATORY: Denies any cough, wheezing. CARDIOVASCULAR: Denies any cardiopulmonary, palpitations. GASTROINTESTINAL: Denies any nausea, vomiting,  diarrhea, abdominal pain. GENITOURINARY: Denies any dysuria, hematuria. ENDO: Denies any polyuria, nocturia. HEME/LYMPH: Denies any anemia, easy bruisability. INTEGUMENT: Denies any acne, rash. MUSCULOSKELETAL: Denies any swelling, gout. NEUROLOGICAL: Denies any numbness, tremors. PSYCH: Denies any history of anxiety, depression.   PHYSICAL EXAMINATION:  VITAL SIGNS: Temperature 95.7, heart rate 66, respiratory rate 18, blood pressure 80/42, pulse oximetry 98% on 100% nonrebreather.   GENERAL: Patient is an elderly, chronically ill-appearing pale Caucasian male laying in bed on 100% nonrebreather.   HEAD: Atraumatic, normocephalic.    EYES: There is pallor. No icterus or cyanosis. Pupils equal, round, pupils are equal, round and reactive to light and accommodation. Extraocular movements intact.   ENT: Wet mucous membranes. No oropharyngeal erythema or thrush.   NECK: Supple. No masses. No JVD. No thyromegaly or lymphadenopathy.   CHEST WALL: No tenderness to palpation. Not using accessory muscles of respiration. No intercostal muscle retractions.   LUNGS: Patient has bilateral crepitations.   CARDIOVASCULAR: S1, S2 regular. There is a systolic murmur. No rubs or gallops.   ABDOMEN: Soft, nontender, nondistended. No guarding. No rigidity. No organomegaly. Normal bowel sounds.   SKIN: No rashes or lesions.   PERIPHERIES: No pedal edema. 2+ pedal pulses.   MUSCULOSKELETAL: No cyanosis or clubbing.   NEUROLOGIC: Awake, alert, oriented x3. Nonfocal neurologic exam. Cranial nerves grossly intact. Patient is lethargic but oriented.   PSYCH: Normal mood and affect.   LABORATORY, DIAGNOSTIC AND RADIOLOGICAL DATA: Urinalysis shows no evidence of infection. Plasma ammonia less than 25. ABG pH 7.36, pCO2 63, pO2 77, FiO2 44. CT head showed no acute abnormalities. White count 7.7, hemoglobin 11.4, hematocrit 34.8, platelet count 155, glucose 198, BUN 20, creatinine 0.89, sodium 141, potassium  2.5, chloride 100, carbon dioxide 39, calcium 8.2, bilirubin 0.3, alkaline phosphatase 49, ALT 21, AST 20. Cardiac enzymes negative.   ASSESSMENT AND PLAN: 81 year old male with history of pulmonary hypertension, pulmonary fibrosis, chronic respiratory failure on home oxygen 6 liters spilled pills from his pillbox which had been stocked for every day of the week and mixed them up and may have accidentally taken more pills. Subsequently he presented with confusion and slurred speech. He was found to be hypotensive in the ED.  1. Hypotension. There is possible suspicion that patient took extra dose of his Revatio and Lasix because they were all in the same pillbox. Patient will be admitted to the Intensive Care Unit and started on a Levophed drip. Will hold his antihypertensive medications including Lasix and Revatio for the time being. Revatio may have to be restarted as soon as possible once the patient's blood pressure stabilizes since patient does have moderate to severe pulmonary hypertension.  2. Accidental overdose of medications. The ER doctor consulted with the [facility] who recommended supportive care. Will hold all his nonessential medications.  3. Pulmonary hypertension, pulmonary fibrosis, chronic respiratory failure. Patient uses 6 liters of oxygen at home. Currently he was hypoxic on 6 liters and is on 100% nonrebreather. Will try and wean it as tolerated and also use BiPAP if needed.  4. History of chronic diastolic dysfunction/failure. Appears to be compensated at present.  5. History of gastroesophageal reflux disease. Will continue PPI. 6. History of hyperlipidemia. Will hold his statin therapy for the time being.  7. History of gout. Will hold his  colchicine for the time being. Will recheck labs in a.m. Monitor the patient and provide supportive care.  8. Altered mental status. Patient was very lethargic when he presented. This could be due to hypotension. CAT scan of the head  is negative. Will do neuro checks every eight hours. Patient appears to be returning back to his baseline.  9. Anemia likely of chronic disease. Will monitor.  10. Hyperglycemia, possibly reactive versus related to chronic steroid use. Will check a hemoglobin A1c.   11. CODE STATUS. Discussed CODE STATUS. Patient's wife confirmed that he is a DO NOT RESUSCITATE.   Reviewed old medical records, discussed with the ED physician, discussed with the patient and his wife the plan of care and management.   CRITICAL CARE TIME SPENT: 50 minutes.  ____________________________ Cherre Huger, MD sp:cms D: 05/29/2012 15:12:20 ET T: 05/29/2012 15:40:56 ET JOB#: [id number]  cc: Cherre Huger, MD, <Dictator> Venia Carbon, MD  Cherre Huger MD ELECTRONICALLY SIGNED 05/29/2012 18:47

## 2015-02-09 NOTE — Discharge Summary (Signed)
PATIENT NAME:  Brett [name], Brett [name] MR#:  [id number] DATE OF BIRTH:  September 23, 1934  DATE OF ADMISSION:  02/28/2012 DATE OF DISCHARGE:  03/03/2012  PRIMARY CARE PHYSICIAN: Dr. Viviana Simpler.  DISCHARGE DIAGNOSES:  1. Acute on chronic hypoxic respiratory failure. 2. Acute on chronic diastolic heart failure and right heart failure with moderate to severe pulmonary hypertension.  3. Chronic pulmonary fibrosis.  4. Paroxysmal atrial fibrillation.  5. Dehydration.  6. Hypokalemia.   CONDITION: Stable.   PROCEDURE:  None.   MEDICATIONS:  1. Vitamin D3 1000 units p.o. daily.  2. Etodolac 500 mg p.o. daily. 3. PreserVision p.o. one tab daily.  4. Omeprazole 20 mg p.o. b.i.d.  5. Vitamin B12 1000 mcg p.o. daily. 6. Aspirin 81 mg p.o. daily.  7. Colchicine 0.6 mg p.o. daily.  8. Zyrtec 10 mg p.o. at bedtime.  9. Lasix 20 mg p.o. daily.  10. Pravastatin 40 mg p.o. daily.  11. Tramadol 50 mg p.o. q.8h. p.r.n. for pain.  12. Multivitamin 1 tab p.o. daily. 13. Symbicort 160 mcg/4.5 mcg inhalation, two puffs b.i.d.    ADDITIONAL MEDICATION: Revatio 20 mg p.o. t.i.d.   STOP MEDICATION:  1. Lopressor 25 mg p.o. b.i.d.  2. Levaquin 250 mg p.o. daily.   DIET: Low sodium diet.   ACTIVITY: As tolerated.   FOLLOW-UP CARE:  1. Follow-up with PCP within one week. 2. Follow-up with Dr. Nehemiah Massed, cardiology and Dr. Humphrey Rolls, pulmonary physician, within 1 to 2 weeks.  3. In addition, the patient needs [facility] PT, needs home oxygen 6 liters, and hold Lasix if blood pressure less than 712 systolic or diastolic less than 60.   CONSULTATIONS:  1. Cardiology, Dr. Nehemiah Massed. 2. Pulmonary, Dr. Humphrey Rolls.   HOSPITAL COURSE: The patient is a 81 year old Caucasian male with a history of chronic pulmonary fibrosis, chronic respiratory failure, chronic diastolic congestive heart failure with normal ejection fraction, who presented to the ED with shortness of breath for 5 to 4 days, even he is on 6 liters  home oxygen. He was treated with Lasix in the ED for possible acute on chronic diastolic congestive heart failure. However the patient's BNP is 9,600 this time compared with 15,000 during last admission. In addition, the patient was treated with DuoNeb. After admission the patient has been treated with Lasix 20 mg IV every 12 hours. Dr. Nehemiah Massed evaluated the patient, recommended continuing current treatment. No further cardiac work-up. Dr. Humphrey Rolls, pulmonary physician, evaluated the patient and recommended Revatio 20 mg p.o. t.i.d. for pulmonary hypertension. After starting Revatio, the patient's symptoms are getting better, however, the patient developed hypotension so Lopressor was discontinued. The patient is still on 6 liters oxygen for shortness of breath and has become better.   The patient developed dehydration, possibly due to Lasix. BUN increased to 30 and then decreased to 26 and 18 today. In addition, the patient has hypokalemia. Potassium is 3.2 today. The patient was treated with Klor-Con. Dr. Humphrey Rolls spoke to Dr. Lake Bells regarding the patient's therapy. Dr. Lake Bells is in agreement with starting Revatio and suggested considering Valsartan which can be started as an outpatient and monitor PA pressure as outpatient with a follow-up echo. If the patient's symptoms are improving and the patient is relatively stable, the patient will be discharged to home with [facility] and physical therapy. In addition, the patient needs to continue home oxygen at 6 liters.   PHYSICAL EXAMINATION: The patient's vital signs today are temperature 97.6, blood pressure 116/65, pulse 102, respirations 16,  oxygen saturation 95% on 6 liters of oxygen. CARDIOVASCULAR: S1, S2 irregular rate and rhythm. PULMONARY: Bilateral air entry. No wheezing. No rales, but has chronic crackles, possibly due to pulmonary fibrosis. EXTREMITIES: No edema, clubbing, or cyanosis.   DISPOSITION: The patient is relatively stable and will be  discharged to home with home health today. I discussed the patient's discharge plan with the patient, nurse, and case manager.   TIME SPENT: About 40 minutes.    ____________________________ Demetrios Loll, MD qc:ap D: 03/03/2012 15:36:51 ET T: 03/04/2012 14:05:02 ET JOB#: [id number]  cc: Demetrios Loll, MD, <Dictator> Venia Carbon, MD Demetrios Loll MD ELECTRONICALLY SIGNED 03/06/2012 19:22

## 2015-02-09 NOTE — Consult Note (Signed)
PATIENT NAME:  Brett [name], Brett [name] MR#:  [id number] DATE OF BIRTH:  1934-02-03  DATE OF CONSULTATION:  02/29/2012  REFERRING PHYSICIAN:  Mena Pauls, MD  CONSULTING PHYSICIAN:  Allyne Gee, MD  REASON FOR CONSULTATION: Pulmonary fibrosis with acute on chronic respiratory failure.   HISTORY OF PRESENT ILLNESS: The patient is a 81 year old Caucasian male who is followed by Dr. Lake Bells at [facility] who presents to the hospital with increasing shortness of breath. Apparently he says that he was diagnosed with pulmonary fibrosis by Dr. Leonides Schanz about five years ago, and he has done relatively well. About two years ago, he started using oxygen at home continuously. He also has had an echo in March which showed right ventricular systolic pressures of about 60 and moderate-to-severe  pulmonary hypertension. The patient comes in now with increasing shortness of breath. He has had increased work of breathing. He says. moving around just in the bed makes him short of breath. When he was seen in the Emergency Room, he was noted to be desaturating with saturations down into the 70s. He has also had some peripheral edema noted. The patient has been on Lasix, and there has been no improvement with his edema.   PAST MEDICAL HISTORY: Significant for: 1. Pulmonary fibrosis. 2. Chronic respiratory failure.  3. Diastolic heart failure.  4. Gastroesophageal reflux disease which he states was the cause of his pulmonary fibrosis.  5. History of chronic hemorrhoids. 6. Atrial fibrillation.   PAST SURGICAL HISTORY: Significant for: 1. Back surgery. 2. Aortic valve replacement.   ALLERGIES: Penicillin and sulfa. He is also allergic shellfish.   MEDICATIONS: Medications are reviewed on the Electronic Medical Record.   FAMILY HISTORY: Positive for malignancy of the breast.   SOCIAL HISTORY: He does not smoke. He used to smoke, quit in the 60s. He has no major asbestos exposure. 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 He was  not aboard any [employer]. The patient is a social alcohol user.   REVIEW OF SYSTEMS: A full 12-point review of systems was performed and is as noted above in the history of present illness.   PHYSICAL EXAMINATION:  GENERAL: At the time that he was evaluated, he was visibly short of breath. He was sitting up in the bed. He is on oxygen therapy at this time.   VITAL SIGNS: Temperature was 98.9, pulse 78, respiratory rate 24, blood pressure 120/77, saturations were 95% on 6 liters nasal cannula.   NECK: Neck appeared to be supple. There is no JVD. No adenopathy. No thyromegaly.   CHEST: Coarse breath sounds with positive rales in both lung fields about three-quarters of the way up.   CARDIOVASCULAR: S1, S2 is normal. Regular rhythm. No gallop or rub.   ABDOMEN: Soft and nontender. No rebound or rigidity was noted.   EXTREMITIES: Positive for edema. Pulses were equal.   NEUROLOGICAL: He was awake and alert, able to move all four extremities, but gait was not checked.   SKIN: Skin without any acute rashes.   MUSCULOSKELETAL: Without any active synovitis.   LABORATORY, DIAGNOSTIC AND RADIOLOGICAL DATA:  White count 7.1, hemoglobin 10.6, hematocrit 32.5.  The patient's BUN was 26, creatinine 1.08. Serum CO2 was 36.  Troponins were negative.  On admission, he had an ABG which was 7.42, 58, 74.  The patient's chest x-ray that was done showed basically a stable-appearing chest film compared to March of 2013. There were some increased interstitial markings. No definitive pneumonia was noted.   IMPRESSION:  1. Acute on chronic respiratory failure.  2. Severe end-stage pulmonary fibrosis.  3. Pulmonary hypertension.   PLAN: The patient is visibly short of breath. He obviously has worsening of his pulmonary fibrosis and also compounding the factor is his pulmonary hypertension. He may actually benefit from an attempt try to lower his pulmonary pressures ideally with Bosentan, but we can try  starting him on Revatio while he is here in the hospital. He does, of note, have an appointment scheduled at [facility] to have another opinion, and he will follow up, but that is not until June or July, he states.  So I will go ahead and order the Revatio now, and I will monitor his progress. I will continue with other supportive care, oxygen therapy. I do not believe there is an acute infection at this time. Also, steroids are not likely to be of major help; but he has been started on methylprednisone.   Thank you for consulting me in the care of this patient.   ____________________________ Allyne Gee, MD sak:cbb D: 02/29/2012 11:12:45 ET T: 02/29/2012 12:04:06 ET JOB#: [id number]  cc: Allyne Gee, MD, <Dictator> Allyne Gee MD ELECTRONICALLY SIGNED 03/03/2012 11:57

## 2015-02-09 NOTE — H&P (Signed)
PATIENT NAME:  Brett [name], Brett [name] MR#:  [id number] DATE OF BIRTH:  January 10, 1934  DATE OF ADMISSION:  02/28/2012  PRIMARY CARE PHYSICIAN: Viviana Simpler, MD  CARDIOLOGIST: Serafina Royals, MD  PULMONOLOGIST:  Simonne Maffucci, MD - [place]  CHIEF COMPLAINT: Increasing shortness of breath.  HISTORY OF PRESENT ILLNESS: This is a 81 year old male who has chronic pulmonary fibrosis, chronic respiratory failure. He uses 6 liters of oxygen at home. Also history of bioprosthetic valve replacement, severe pulmonary hypertension, chronic diastolic failure, history of atrial fibrillation, and gastroesophageal reflux disease. His last echocardiogram in March of 2013 was suggestive of elevated right ventricular systolic pressure of 60 mmHg consistent with moderate to severe pulmonary hypertension, ejection fraction of greater than 55%, and impaired LV relaxation. He normally uses 6 liters of oxygen at home. He says he usually runs like 94 to 95% on 6 liters with ambulation and he remains in the 90s throughout. He is saying that like for the last four to five days he has been having increasing shortness of breath. He is short of breath with minimal activity, even doing minimal work like getting on clothes or minimal ambulation would cause his saturation to drop down to like in the mid 70s. He is also complaining of some leg swelling going on for a few days. He was started on p.o. Lasix as an outpatient two to three days ago, but his swelling has not gone down significantly with the p.o. Lasix. He is also not making a significant amount of urine with the p.o. Lasix. He was also dizzy one time he said during these last four to five days. He says he cannot take a deep breath, but denies any chest pain or chest heaviness at this time. In the emergency room, as per EMS, his saturations were in the upper 70s and mid 80s. When he was brought into the emergency room he was like 86% on 6 liters nasal cannula. He also recently  completed a prednisone taper starting from 04/26 to 02/18/2012. His baseline weight is around 188 pounds. He says his weight is running in the range of 190 pounds. So he is being admitted for acute on chronic hypoxic respiratory failure with worsening pulmonary fibrosis, acute on chronic diastolic failure. He got 20 mg of IV Lasix in the emergency room and he is getting DuoNebs.   REVIEW OF SYSTEMS: He denies any fever. He only has minimal weight gain. No acute change in vision. No headache. He had some mild dizziness before. He has chronic cough. He has dyspnea but no painful respiration. No hemoptysis. He is complaining of dyspnea on exertion, but no orthopnea, no palpitations or syncope. No nausea, vomiting, diarrhea, or abdominal pain. No GI bleed. He denies any dysuria or frequency but mainly complaining of dark-colored urine. No thyroid problems. No anemia. No rash. No joint pain, but is mainly complaining of leg swelling. No focal numbness or weakness. No anxiety.   PAST MEDICAL HISTORY:  1. Pulmonary fibrosis. 2. Chronic respiratory failure and uses 6 liters of oxygen at home. 3. Moderate to severe pulmonary hypertension. His last echo was suggestive of right ventricular systolic pressure greater than 60 mmHg.  4. Chronic diastolic failure.  5. Possible chronic obstructive pulmonary disease.  6. History of bioprosthetic valve replacement of the aortic valve.  7. Gastroesophageal reflux. 8. Arthritis. 9. History of arrhythmias. 10. Hemorrhoids. 11. Paroxysmal atrial fibrillation.   PAST SURGICAL HISTORY:  1. Lower back surgery. 2. Microsurgery. 3. Bioprosthetic aortic valve replacement.  DRUG ALLERGIES: Sulfa, penicillin and shellfish. Penicillin causes anaphylaxis.   HOME MEDICATIONS:  1. Aspirin 81 mg daily.  2. Colchicine 0.6 mg daily.  3. Colace 100 mg twice a day. 4. Etodolac 500 mg daily.  5. Lasix 20 mg daily, recently started. 6. Levaquin 500 mg daily.  7. Metoprolol 25  mg twice a day. 8. Multivitamin daily.  9. Omeprazole 20 mg twice a day. 10. Pravastatin 40 mg daily. 11. PreserVision daily.  12. Tramadol 50 mg every 8 hours p.r.n.  13. Vitamin B12 1000 mcg daily.  14. Vitamin D3 1000 units daily.  15. Zyrtec 10 mg daily at bedtime. 16. The patient has not been taking Symbicort at home. It was taken out, as per the patient.   SOCIAL HISTORY: He lives with his wife. He quit smoking in the 1960s. Occasional alcohol use. No drug use.   FAMILY HISTORY: Sister had breast cancer.  PHYSICAL EXAMINATION:   VITAL SIGNS: When he presented to the emergency room heart rate was 89, respiratory rate 24, blood pressure 118/74, and saturating 86% on 6 liters nasal cannula. Currently he is saturating from 88 to 92% on 6 liters nasal cannula.  GENERAL: He is an elderly Caucasian male, well built. He is sitting upright in bed. He does not appear to be in acute distress. Not using accessory muscles of respiration.   HEENT: Bilateral pupils are equal. Extraocular muscles are intact. No scleral icterus. No conjunctivitis. Oral mucosa is moist. No pallor.   NECK: No thyroid tenderness, enlargement, or nodules. Neck is supple. No masses and nontender. No adenopathy. No carotid bruits. Has some JVD and venous pulsations, maybe because of pulmonary hypertension.   LUNGS: He has bilateral crackles throughout, fine crackles, most likely secondary to pulmonary fibrosis. Normal respiratory effort. Not using accessory muscles for respiration.   HEART: Heart sounds are regular. There is a murmur. Good peripheral pulses. He has bilateral lower extremity edema.   ABDOMEN: Soft and nontender. Normal bowel sounds. No hepatosplenomegaly. No bruit. No masses.   RECTAL: Examination is deferred.   NEUROLOGIC: He is awake, alert, and oriented to time, place, and person. Cranial nerves are intact. Moving all extremities against gravity.   EXTREMITIES: No cyanosis. No clubbing.   SKIN:  No rash. No lesions.   LABS/STUDIES: White count 7.7, hemoglobin 11.1, and platelet count 252,000. BMP: Sodium 142, potassium 3.8, BUN 30, and creatinine 1.08. His LFTs are normal. His albumin is 2.7. BNP is 9663. His liver functions have improved from before. His BNP last time was 15,300.   His chest x-ray is reported as stable appearing chest compared to 12/29/2011, diffuse thickening of interstitial lung markings bilaterally secondary to interstitial fibrosis. No pneumonia.   His echocardiogram on 12/29/2011 showed ejection fraction of greater than 55%, impaired LV relaxation, and he has elevated right ventricular pressure at greater than 60 mmHg consistent with moderate to severe pulmonary hypertension.   IMPRESSION:  1. Acute on chronic hypoxic respiratory failure, multifactorial, with acute on chronic diastolic failure and right heart failure with moderate to severe pulmonary hypertension and chronic pulmonary fibrosis. 2. History of bioprosthetic aortic valve replacement. 3. Paroxysmal atrial fibrillation, currently in sinus rhythm.   PLAN: This is a 81 year old male with significant pulmonary fibrosis, he has chronic respiratory failure and uses 6 liters of oxygen at home, also moderate to severe pulmonary hypertension and chronic diastolic failure with a normal ejection fraction. He was last admitted in March of 2013 because of acute on chronic respiratory  failure. He presented with increasing lower extremity edema and increasing shortness of breath for the last four to five days. His chest x-ray is baseline abnormal with significant pulmonary fibrosis. He got 20 mg of IV Lasix in the emergency room. I am going to give him some gentle diuresis with 20 mg IV Lasix every 12 hours. He does not appear to be in significant liver congestion at this time because his LFTs are normal and his BNP is actually on the lower side from the last time. His last BNP was 15,000 and now it is like 9600. His renal  function should be monitored on Lasix. He was also given some DuoNebs. We will get a pulmonary consult with the on-call pulmonologist at this time. His regular pulmonologist does not come to [facility]. We will get serial cardiac enzymes on him. We will also call a cardiology consult with Dr. Nehemiah Massed, as requested by the patient. There does not appear to be any infection at this time, his white count is normal, and he has been getting Levaquin at home. We will continue that for now. We will discuss with pulmonology regarding steroids.  TIME SPENT ON ADMISSION AND COORDINATION OF CARE: 50 minutes. ____________________________ Mena Pauls, MD ag:slb D: 02/28/2012 13:09:45 ET T: 02/28/2012 14:54:59 ET JOB#: [id number]  cc: Mena Pauls, MD, <Dictator> Venia Carbon, MD Juanito Doom, MD Mena Pauls MD ELECTRONICALLY SIGNED 03/26/2012 12:28

## 2015-02-09 NOTE — Consult Note (Signed)
General Aspect 81 year old male with a known history of chronic pulmonary fibrosis dating back to 2003, requiring 5 to 6 liters of oxygen, s/p AVR, carotid stenosis,  presenting with worsening SOB. Cardiology was consulted for SOB, elevated BNP, possible acute diastolic CHF.  The patient is followed by [facility], with recent change from an oxygen concentrator to liquid oxygen for a planned trip to [place]. Notes indicate he feels his  breathing has gotten worse after this change. He used to take 3 to 4 liters of oxygen via concentrator, but now he is using 6 liters via liquid form of oxygen. He has been coughing for about the last 2 to 3 days. He returned Sunday early morning around 2:00 from [place]. per his daughter, the patient almost collapsed in the bathroom yesterday.  They called  EMS as his oxygen saturation was down into 50s and EMS put him on the couch and finally he started moving  and his oxygen saturations slowly climbed so they decided not to come to the hospital.Monday AM,  he was feeling very weak, could not get out of the bed. he remained hypoxic and unable to move.    Present Illness . Past Medical History:   Allergic rhinitis   Colonic polyps, hx of--tubular adenoma   Diverticulosis, colon   Gout   Hypertension   Osteoarthritis--hands, hips   Pulmonary fibrosis - PFT's 01/01/07 VC 56% DLC0 42% not corrected for anemia   - PFT's 07/20/10 VC 48% DLC 0 41%   - 02 dep since June 2011   - Review for Perfenidone trial August 31, 2010 >>> VC below 50%, does not qualify   -Desats on pulsed 02 a@ 4lpm November 04, 2010 x one lap   Hyperlipidemia   GERD   Aortic stenosis - sp avr 2005   Carotid stenosis  ALLERGIES: Sulfa, penicillin and shellfish.   SOCIAL HISTORY: He is a former smoker, quit in 2004. He was a moderate alcohol drinker, never drank heavily as per the daughter.   FAMILY HISTORY: Father with heart failure and pulmonary fibrosis, died at 63.   Physical  Exam:   GEN WD, WN, NAD    HEENT red conjunctivae    NECK supple    RESP normal resp effort  rhonchi  crackles    CARD Regular rate and rhythm  Murmur    Murmur Systolic    Systolic Murmur Out flow    ABD soft    LYMPH negative neck    EXTR negative edema    SKIN normal to palpation    NEURO motor/sensory function intact    PSYCH alert, A+O to time, place, person, good insight, lethargic   Review of Systems:   Subjective/Chief Complaint SOB    General: Fatigue    Skin: No Complaints    ENT: No Complaints    Eyes: No Complaints    Neck: No Complaints    Respiratory: Short of breath    Cardiovascular: Dyspnea    Gastrointestinal: No Complaints    Genitourinary: No Complaints    Vascular: No Complaints    Musculoskeletal: No Complaints    Neurologic: No Complaints    Hematologic: No Complaints    Endocrine: No Complaints    Psychiatric: No Complaints    Review of Systems: All other systems were reviewed and found to be negative    ROS Patient is lethargic, poor historian    Medications/Allergies Reviewed Medications/Allergies reviewed     gerd:    Diverticulitis:  Hemorrhoids:    Pulmonary Fibrosis:    Atrial Fibrillation:    Arthritis:        Admit Diagnosis:   ACUTE RESPIRATORY FAILURE: 28-Dec-2011, Active, ACUTE RESPIRATORY FAILURE  Home Medications: Medication Instructions Status  Spectravite Senior oral tablet 1 tab(s) orally once a day Active  Vitamin D3 1000 intl units oral tablet 1 tab(s) orally once a day Active  etodolac 500 mg oral tablet 1 tab(s) orally once a day Active  PreserVision oral tablet 1 tab(s) orally once a day Active  omeprazole 20 mg oral delayed release capsule 1 cap(s) orally 2 times a day Active  colchicine 0.6 mg oral tablet 1  orally once a day Active  pravastatin 40 mg oral tablet 1 tab(s) orally once a day (at bedtime) Active  tramadol 50 mg oral tablet 1 tab(s) orally 2-3 times per day  Active  Vitamin B-12 1000 mcg oral tablet 1 tab(s) orally once a day Active  aspirin 81 mg oral tablet 1 tab(s) orally once a day Active  allergy relief 10 mg 1 tab(s)  once a day Active  stool softener 1   2 times a day Active     Routine Hem:  11-Mar-13 11:25    WBC (CBC) 3.6   RBC (CBC) 3.95   Hemoglobin (CBC) 11.0   Hematocrit (CBC) 33.7   Platelet Count (CBC) 176   MCV 85   MCH 27.9   MCHC 32.8   RDW 16.7   Neutrophil % 67.4   Lymphocyte % 27.2   Monocyte % 5.0   Eosinophil % 0.1   Basophil % 0.3   Neutrophil # 2.4   Lymphocyte # 1.0   Monocyte # 0.2   Eosinophil # 0.0   Basophil # 0.0  Routine Chem:  11-Mar-13 11:25    Glucose, Serum 128   BUN 42   Creatinine (comp) 1.55   Sodium, Serum 137   Potassium, Serum 4.7   Chloride, Serum 98   CO2, Serum 30   Calcium (Total), Serum 8.3  Hepatic:  11-Mar-13 11:25    Bilirubin, Total 0.5   Alkaline Phosphatase 45   SGPT (ALT) 648   SGOT (AST) 1269   Total Protein, Serum 8.7   Albumin, Serum 3.1  Routine Chem:  11-Mar-13 11:25    Osmolality (calc) 286   eGFR (African American) 57   eGFR (Non-African American) 47   Anion Gap 9  Routine Coag:  11-Mar-13 11:25    D-Dimer, Quantitative 5.59  Cardiac:  11-Mar-13 11:25    Troponin I 0.85  Routine Chem:  11-Mar-13 11:25    B-Type Natriuretic Peptide [facility]) 15300  Cardiac:  11-Mar-13 19:02    Troponin I 1.04   CK, Total 269   CPK-MB, Serum 3.1   EKG:   Interpretation EKG shows NSR with rate 100 bpm, no significant ST or T wave changes   Radiology Results: XRay:    11-Mar-13 12:10, Chest PA and Lateral   Chest PA and Lateral    REASON FOR EXAM:    Shortness of breath  COMMENTS:       PROCEDURE: DXR - DXR CHEST PA (OR AP) AND LATERAL  - Dec 27 2011 12:10PM     RESULT: There is diffuse thickening of the lung markings bilaterally.   Pulmonary fibrosis would be the primary consideration as to etiology. No   consolidated mass is seen. No pleural effusion  is noted. Heart size is   upper limits for normal. Postoperative changes of  prior cardiac surgery   are observed.    IMPRESSION:   1. There is diffuse bilateral thickening of the lung markings, most   compatible with pulmonary fibrosis.  2. Followup examination is suggested to document stability.  Thank you for the opportunity to contribute to the care of your patient.           Verified By: Dionne Ano WALL, M.D., MD  Nuclear Med:    11-Mar-13 15:21, Lung VQ Scan - Nuc Med   Lung VQ Scan - Nuc Med    REASON FOR EXAM:    sob after long trip, tachycardic hi ddimer hi creat  COMMENTS:       PROCEDURE: NM  - NM VQ LUNG SCAN  - Dec 27 2011  3:21PM     RESULT: Following aerosol administration of 38.81 mCi technetium 65m  DTPA, ventilation lung scan was performed in 8 views. After the   ventilation scan and following intravenous administration of 4.36 mCi   technetium 954microaggregated albumin, perfusion lung scan was performed   in 8 views. There is a diffusely heterogeneous distribution of tracer   activity in both lungs, more prominent on the ventilation scan which   would favor ventilatory disease. No mismatched perfusion defects   indicative of pulmonary embolus are identified.    IMPRESSION:   1. Ventilation perfusion lung scan is considered low probability for   pulmonary embolus.          Verified By: JADionne AnoALL, M.D., MD    Sulfa drugs: Rash  Penicillin: Rash  Shellfish: Rash  Vital Signs/Nurse's Notes: **Vital Signs.:   12-Mar-13 05:00   Vital Signs Type Routine   Temperature Temperature (F) 97   Celsius 36.1   Temperature Source oral   Pulse Pulse 74   Respirations Respirations 23   Systolic BP Systolic BP 10253 Diastolic BP (mmHg) Diastolic BP (mmHg) 60   Mean BP 75   Pulse Ox % Pulse Ox % 97   Pulse Ox Activity Level  At rest   Oxygen Delivery HFNC 35L_0 %   Pulse Ox Heart Rate 7670   Impression [age]ear old male with a known history of  pulmonary  fibrosis on home oxygen, requiring 5 to 6 liters of oxygen, s/p AVR, carotid stenosis,  presenting with worsening SOB, elevated BNP, possible acute diastolic CHF, elevated cardiac enz.  A/P: 1) SOB h/o Pulmonary fibrosis dating back to 2003 Worsening sx since 09/2011 per the pulmonary notes from Dr. McLake Bellsecent oxygen change to liquid. Hypoxia on arrival. Need to consider other etiology including viral given elevated liver enz. echo pending to exclude worsening pulmonary HTN. On bipap (nasal), with ABX. Would continue gentle diuresis for sx while echo pending  2) elevated cardiac enz known PVD Suspect he has CAD. In setting of profound hypoxia, this would explain climb in c ardiac enz. Now trending down. Can d/c heparin as he has no symptoms, enz trending down. No plan for cardiac cath Could do outpt myoview  3) AVR echo pending to evaluate  4) Transaminitis suggests either viral etiology or significant hepatic congestion. Does not appear that medications are contributing  Could be acute and will need to monitor closely for trend.   Electronic Signatures: GoIda RogueMD)  (Signed 14-Mar-13 20:11)  Authored: General Aspect/Present Illness, History and Physical Exam, Review of System, Past Medical History, Health Issues, Home Medications, Labs, EKG , Radiology, Allergies, Vital Signs/Nurse's Notes, Impression/Plan   Last Updated: 14-Mar-13  20:11 by Ida Rogue (MD)

## 2015-02-09 NOTE — H&P (Signed)
PATIENT NAME:  Brett [name], Brett [name] MR#:  [id number] DATE OF BIRTH:  Jan 15, 1934  DATE OF ADMISSION:  12/27/2011  PRIMARY CARE PHYSICIAN:  Dr. Viviana Simpler.  CARDIOLOGY: Dr. Rockey Situ.  PULMONARY: Dr. Lake Bells at [facility].  REQUESTING PHYSICIAN: Dr. Cinda Quest.   CHIEF COMPLAINT: Shortness of breath.   HISTORY OF PRESENT ILLNESS: The patient is a 81 year old male with a known history of chronic pulmonary fibrosis requiring 5 to 6 liters of oxygen and arthritis is being admitted for multiorgan failure and acute on chronic respiratory failure likely due to underlying pulmonary fibrosis with possible viral infection. The patient followed at [facility] here at [facility] for his chronic pulmonary fibrosis for rehab and was using oxygen concentrator for the last 2-1/2 to three weeks ago when he was requested to change to liquid oxygen and had a trip planned for [place]. Since then his breathing has gotten some worse and he started to feel that it might have been the transition of his oxygen that led to it. He used to take 3 to 4 liters of oxygen via concentrator, but now he is using 6 liters via liquid form of oxygen. He was scheduled to get dermatological procedure today, but has been feeling short of breath, has been coughing badly for about the last 2 to 3 days. He returned Sunday early morning around 2:00 and had a continuous drive which wore him out. As per his daughter, the patient almost collapsed in the bathroom yesterday. Does not know much detail what happened, but by the time she went to see him he was quite awake. They did call EMS as his oxygen saturation was down into 50s and EMS put him on the couch and finally he started moving around some and his oxygen saturations slowly were coming up, so they decided not to come to the hospital. This morning he was feeling very weak, could not get out of the bed in spite of continuous effort by his family members for about 45 minutes, but he remained hypoxic  and unable to move. Finally, he was brought down to the Emergency Department as he was not doing well. While in the ED he was having significant trouble breathing, was found to be tachypneic, tachycardic and was placed on BiPAP. When I evaluated the patient, the patient was quite sedated while on the BiPAP machine. He had received 1 mg IV Ativan along with aspirin and Plavix which likely made him sedated.   Most of the history was obtained from the patient's daughter who was at the bedside. As per the patient's daughter, the patient might have gotten some viral infection from [place]. Since he came from [place], he has been having bad coughing spell and some throat pain.   PAST MEDICAL HISTORY:  1. Chronic pulmonary fibrosis.  2. Gastroesophageal reflux disease.  3. Arthritis.  4. History of arrhythmias.  5. Diverticulitis.  6. Hemorrhoids.  7. History of atrial fibrillation.   ALLERGIES: Sulfa, penicillin and shellfish.   REVIEW OF SYSTEMS: Unobtainable as the patient is on BiPAP and sedated.   SOCIAL HISTORY: He is a former smoker, quit in 2004. He was a moderate alcohol drinker, never drank heavily as per the daughter.   MEDICATIONS AT HOME:  1. Allergy Relief 10 mg p.o. daily.  2. Aspirin 81 mg p.o. daily. 3. Colchicine 0.6 mg p.o. daily.  4. Etodolac 500 mg p.o. daily.  5. Omeprazole 20 mg p.o. b.i.d. 6. Pravastatin 40 mg p.o. at bedtime.  7. PreserVision 1  tablet p.o. daily. 8. Spectravite Senior 1 tablet p.o. daily. 9. Stool softener 1 tablet p.o. b.i.d.  10. Tramadol 50 mg p.o. 2 to 3 times daily. 11. Vitamin B12 1,000 mcg p.o. daily. 12. Vitamin D3 1,000 international units p.o. daily.   FAMILY HISTORY: Father with heart failure and pulmonary fibrosis, died at 3.   PHYSICAL EXAMINATION:  VITAL SIGNS: Temperature 97.4, heart rate 100 per minute, respirations 18 per minute, blood pressure 107/72. He was saturating 96% on nonrebreather mask. Now he is on BiPAP saturating  97%.   GENERAL: The patient is a 81 year old male lying in the bed looking critically sick while on BiPAP.   EYES: Pupils are equally reactive to light and accommodation. No scleral icterus. Extraocular muscles intact.   HEENT: Unable to examine oral flora as he has BiPAP applied.   SKIN: Dry. No rashes.   LUNGS: Decreased breath sounds at the bases bilaterally. He does have occasional rhonchi. No wheezing. No rales.   CARDIOVASCULAR: S1, S2 normal, tachycardic. No murmurs, rubs, or gallops.   ABDOMEN: Soft, nontender, nondistended. Bowel sounds present. No organomegaly or mass.   EXTREMITIES: No pedal edema, cyanosis, or clubbing.   NEUROLOGIC: Unable to evaluate as he is sedated.   NECK: Supple. No jugular venous distention. No thyroid enlargement or tenderness.  PSYCHIATRIC: Unable to assess as the patient is sedated from Ativan. He has BiPAP on.   SKIN: No obvious rash, lesion or ulcer.   LABORATORY, DIAGNOSTIC, AND RADIOLOGICAL DATA: Normal BMP except BUN of 42, creatinine 1.55. Serum calcium 8.3, blood glucose 128. BNP of 15,300. LFTs showed AST 1,269 and ALT 648, total protein of 8.7. Troponin of 0.85. WBC 3.6, hemoglobin 11, hematocrit 33.7, platelets 176. D-dimer 5.69, pH 7.28, pCO2 66, pO2 166, bicarbonate 31. Repeat ABG showed pH of 7.27, pCO2 of 74, bicarbonate 34. CT scan of the head without contrast while in the Emergency Department showed no acute intracranial process. Chest x-ray shows chronic pulmonary fibrosis. VQ scan was suggestive of low probability for PE. EKG showed normal sinus rhythm. No major ST-T changes.   IMPRESSION AND PLAN:  81 year old male with multiorgan failure: 1. Multiorgan failure with heart, lung, liver, renal failure, extremely poor prognosis. We will consult cardiology, Dr. Rockey Situ, pulmonary, Dr. Raul Del. Discussed with palliative care, Dr. Ermalinda Memos. Will admit him to Critical Care Unit. I do not find any obvious infectious source at this time.  This could be viral in nature. 2. Acute on chronic respiratory failure usually requiring 6 liters of oxygen at home. Here he is on BiPAP. This could be due to viral infection with underlying severe pulmonary fibrosis. Will consult pulmonary, Dr. Raul Del, start him on IV Solu-Medrol, nebulizer treatment and antibiotic for now, keep him in Critical Care Unit. He may need to be intubated if his breathing does not improve.  3. Elevated troponin, likely due to supply/demand ischemia, but cannot rule out myocardial infarction at this time. We will start him on aspirin and heparin at this time until he rules it out. We will consult cardiology, obtain 2-D echo.  4. Elevated liver function tests. Unsure whether this is acute or chronic in nature. This could be from shock liver also. As per family, the patient drank moderate alcohol. Will request records to see exact nature of his elevated liver function tests. Will avoid hepatotoxins at this time.  5. Acute renal failure. We will hold colchicine and etodolac. Will avoid any nonsteroidals. This could be CKD also again. We will have to  get old records to compare his kidney function with. 6. CODE STATUS: FULL CODE. This was discussed with the patient's daughter at bedside who indicated she would go to the patient's wife who might have living will and is willing to discuss with palliative care for long-term goals.   TOTAL TIME TAKING CARE OF THIS PATIENT: 55 minutes (critical care). The patient remains at high risk for cardiopulmonary arrest and possible death considering his multiorgan failure.   ____________________________ Lucina Mellow. Manuella Ghazi, MD vss:ap D: 12/27/2011 16:52:34 ET T: 12/27/2011 17:21:16 ET JOB#: [id number]  cc: Cierrah Dace S. Manuella Ghazi, MD, <Dictator> Venia Carbon, MD Minna Merritts, MD Juanito Doom, MD Lucina Mellow [facility] MD ELECTRONICALLY SIGNED 12/28/2011 15:15

## 2015-02-09 NOTE — Consult Note (Signed)
PATIENT NAME:  Brett [name], WALDROP MR#:  [id number] DATE OF BIRTH:  February 05, 1934  DATE OF CONSULTATION:  12/30/2011  REFERRING PHYSICIAN:   CONSULTING PHYSICIAN:  Corey Skains, MD  SUBJECTIVE: The patient's overall breathing is improving at this time. Over the last day he has changed from ICU to telemetry without difficulty.   EXAM: Unchanged from previous exam overnight with some wheezing and expiratory wheezing.   Order review is updated. Laboratory results reviewed. Radiology results reviewed. Vital signs reviewed. No significant changes.   ASSESSMENT AND PLAN: Severe pulmonary fibrosis with exacerbation of hypoxia of unknown etiology with respiratory failure and mental status changes, all of which are slightly improved since admission. There has been an elevated troponin more consistent with current illness, hypoxia, and demand ischemia rather than acute myocardial infarction. There is valvular heart disease with previous aortic valve replacement currently stable. There is minimal pulmonary edema with mild congestive heart failure likely secondary to hypoxia, now improving slightly on Lasix. Plan for continuation of intravenous Lasix with low dose beta-blocker for heart rate control and beginning ambulation with no evidence and need of further cardiac diagnostics at this time.   ____________________________ Corey Skains, MD bjk:drc D: 12/31/2011 08:39:00 ET T: 12/31/2011 11:12:39 ET JOB#: [id number]  cc: Corey Skains, MD, <Dictator> Corey Skains MD ELECTRONICALLY SIGNED 01/04/2012 14:04

## 2015-02-09 NOTE — Discharge Summary (Signed)
PATIENT NAME:  Brett [name], Brett [name] MR#:  [id number] DATE OF BIRTH:  Nov 26, 1933  DATE OF ADMISSION:  12/27/2011 DATE OF DISCHARGE:  01/02/2012  CHIEF COMPLAINT: Shortness of breath.   CONSULTANTS:  1. Dr. Izora Gala Phifer, Palliative Care. 2. Dr. Serafina Royals, Cardiology.  3. Dr. Ida Rogue, Cardiology.  4. Dr. Wallene Huh, Pulmonary.  5. Physical Therapy.   DISCHARGE DIAGNOSES:  1. Acute on chronic respiratory failure from pulmonary fibrosis flare. 2. Chronic obstructive pulmonary disease. 3. Diastolic heart failure. 4. Severe pulmonary hypertension. 5. Acute renal failure, resolved.  6. Elevated troponin, likely from demand ischemia.   SECONDARY DIAGNOSES:  1. Gastroesophageal reflux disease. 2. Arthritis. 3. History of arrhythmia. 4. Diverticulitis.  5. History of hemorrhoids.  6. History of atrial fibrillation.    DISCHARGE MEDICATIONS:  1. Spectra-Vite Senior 1 tab daily.  2. Vitamin D 3000 international units daily.  3. Etodolac 500 mg once a day. 4. PreserVision 1 tab daily.  5. Omeprazole 20 mg b.i.d. 6. Colchicine 0.6 mg daily.  7. Tramadol 50 mg, 2 to 3 times per day as needed.  8. Vitamin B-12 1000 mcg daily.  9. Aspirin 81 mg daily. 10. Stool softener 1 to 2 times a day as needed.  11. Prednisone 30 mg daily until seen by Pulmonary. 12. Lasix 20 mg daily. 13. Spiriva 18 mcg inhaled daily.  14. Metoprolol XL 25 mg daily.  15. Advair 250/50 mcg inhaled b.i.d.    ACTIVITY: No exertional activity.   DIET: Low sodium ADA diet.   DISCHARGE INSTRUCTIONS AND FOLLOWUP:   1. The patient will be discharged with home physical therapy and RN on 6 liters of oxygen via nasal cannula.  2. Please follow up with your pulmonologist early next week. 3. Please check liver function tests as well.  4. Please follow up with your cardiologist as well within 1 to 2 weeks.   HISTORY AND PHYSICAL:  Please see the full History and Physical dictated on 12/27/2011 by Dr. Max Sane. Briefly, the patient is a 81 year old male with a history of pulmonary fibrosis on chronic oxygen, 5 to 6 liters, arthritis, admitted for shortness of breath. On arrival, he was found to have significant hypoxia down to 50s as well as elevated troponin, renal failure and admitted to the Hospitalist Service.   SIGNIFICANT LABORATORY, DIAGNOSTIC AND RADIOLOGICAL DATA:  BNP on arrival 15,300, BUN 42, creatinine 1.52. Creatinine on discharge is 0.71.  Initial LFTs showing bilirubin of 0.5, alkaline phosphatase 45, AST 1269 and ALT of 648. On discharge, AST was 80 and ALT was 575. Overall, the ALT peak was 1574.  Initial troponin 0.85, trended down to 0.31.  Initial WBC 3.6, upon discharge 6.9. Initial hemoglobin 11 and hematocrit 33.7.  Initial D-dimer was 5.59.  Initial ABG: pH 7.28, pCO2 66, pO2 was 166 on 100% nonrebreather. Echocardiogram showing severe pulmonary hypertension, normal ejection fraction, diastolic dysfunction.  CT of the head without contrast on arrival showing no acute intracranial process.  X-ray of the chest, PA and lateral, on arrival showing diffuse bilateral thickening of the lung marking most compatible with pulmonary fibrosis.  Repeat x-ray showing worsening appearance of the thorax today due to part of hypoinflation. Increased interstitial densities suggest interstitial edema.  X-ray of the chest on the 13th showing heterogeneous opacities in both lungs, unchanged to slightly increased pulmonary edema or infection. Lung VQ scan showing low probability for PE.  Ultrasound of the lower extremities does not show evidence for deep vein thrombosis on  either limb.   HOSPITAL COURSE: The patient's acute on chronic respiratory failure was secondary to likely a flare of pulmonary fibrosis in addition to chronic obstructive pulmonary disease, pulmonary hypertension which is severe based on the echocardiogram, as well as diastolic heart failure. Pulmonary was consulted. The  patient was started on steroids as well as antibiotics initially. He was placed on BiPAP and Cardiology was consulted as well. He was diuresed. The patient is not on any pulmonary fibrosis medication as an outpatient. The patient was seen by Dr. Raul Del from Pulmonary, who doubted he had pneumonia and, therefore, antibiotics were stopped. The Lasix was continued IV and should be continued as an outpatient. He does have diastolic heart failure based on echocardiogram done here. Furthermore, he does have severe pulmonary hypertension. Per Dr. Raul Del, this patient can be discharged on 30 mg of prednisone, and he should follow with his outpatient pulmonologist and would need definitive therapy for the pulmonary fibrosis. Currently, he is not significantly volume overloaded, and he is back on his 6 liters of oxygen as an outpatient. He does have dyspnea on exertion but this is chronic. He feels more like his baseline. He did have elevated troponins, and he was seen by Cardiology. This was thought to be demand ischemia from significant respiratory failure. He is to continue aspirin and is to have a possible Myoview as an outpatient. The patient did have significant transaminitis which was seen on the LFTs. He is on a statin as an outpatient and that was held. They did trend down. This likely is in the setting of congestive liver. He is to follow up with his PCP and repeat LFTs as an outpatient. He did have some mild renal failure on arrival, and his colchicine and diclofenac were held. He was diuresed and now that has resolved day. He is back to his baseline. He was seen by Physical Therapy, and he is to go home with outpatient physical therapy and then follow up with his PCP early next week as well as pulmonologist.   CODE STATUS:  The patient is a FULL CODE.         TOTAL TIME SPENT: 35 minutes.   ____________________________ Vivien Presto, MD sa:cbb D: 01/02/2012 11:33:17 ET T: 01/03/2012 12:38:45  ET JOB#: [id number]  cc: Vivien Presto, MD, <Dictator> Corey Skains, MD Venia Carbon, MD Vivien Presto MD ELECTRONICALLY SIGNED 01/07/2012 18:41

## 2015-02-09 NOTE — H&P (Signed)
PATIENT NAME:  Brett [name], Brett [name] MR#:  [id number] DATE OF BIRTH:  [birth year]  DATE OF ADMISSION:  02/28/2012  ADDENDUM: His EKG shows that he has significant PVCs and ectopy. He has sinus rhythm, normal axis. He has T wave inversions in anterolateral leads. Considering his severe pulmonary hypertension, the patient is high risk for arrhythmias. We will continue to monitor him on telemetry.  ____________________________ Mena Pauls, MD ag:slb D: 02/28/2012 13:11:50 ET T: 02/28/2012 14:10:10 ET JOB#: [id number]  cc: Mena Pauls, MD, <Dictator> Mena Pauls MD ELECTRONICALLY SIGNED 03/26/2012 12:26

## 2015-02-09 NOTE — Consult Note (Signed)
PATIENT NAME:  Brett [name], Brett [name] MR#:  [id number] DATE OF BIRTH:  [birth year]  DATE OF CONSULTATION:  02/28/2012  REFERRING PHYSICIAN:  Dr. Lyndel Safe  CONSULTING PHYSICIAN:  Corey Skains, MD  PRIMARY CARE PHYSICIAN: Dr. Lake Bells   CHIEF COMPLAINT: "I'm short of breath."   HISTORY OF PRESENT ILLNESS: This 81 year old male with known chronic pulmonary fibrosis, chronic respiratory failure on 6 liters of oxygen at home with a bioprosthetic aortic valve replacement, severe pulmonary hypertension, chronic diastolic dysfunction, congestive heart failure, atrial fibrillation who is currently in normal sinus rhythm with gastroesophageal reflux. The patient has had right ventricular systolic pressures in the 60 mm range and normal LV systolic function with ejection fraction of 55%. The patient has usually normally been with appropriate medications for all of the above and fairly stable. Patient has had a recent significant worsening of shortness of breath, weakness, fatigue over the last several days associated with mild diaphoresis. The patient additionally has had increased requirement of oxygenation. EMS was called and the patient had oxygenation which was low. At that time the patient was given intravenous Lasix which did help. His chest x-ray suggests pulmonary fibrosis and edema.   REVIEW OF SYSTEMS: Remainder review of systems negative for vision change, ringing in the ears, hearing loss, cough, congestion, heartburn, nausea, vomiting, diarrhea, bloody stools, stomach pain, extremity pain, leg weakness, cramping of the buttocks, known blood clots, headaches, blackouts, dizzy spells, nosebleeds, congestion, trouble swallowing, frequent urination, urination at night, muscle weakness, numbness, anxiety, depression, skin lesions, skin rashes.   PAST MEDICAL HISTORY:  1. Pulmonary fibrosis.  2. Aortic valve disease status post bioprosthetic aortic valve replacement. 3. Gastroesophageal reflux. 4. Atrial  fibrillation.  5. Hypertension.  FAMILY HISTORY: Sister has breast cancer. No other cardiovascular disease.   SOCIAL HISTORY: He has remote tobacco use. Currently denies alcohol or tobacco use.   ALLERGIES: He has no known drug allergies.   CURRENT MEDICATIONS: As listed.   PHYSICAL EXAMINATION:  VITAL SIGNS: Blood pressure 126/68 bilaterally, heart rate 72 upright, reclining, and regular.   GENERAL: He is a well appearing male in no acute distress.   HEENT: No icterus, thyromegaly, ulcers, hemorrhage or xanthelasma.   CARDIOVASCULAR: Regular rate and rhythm with normal S1, S2 with a crisp 2 to 3/6 right upper sternal border murmur, nonradiating. Point of maximal impulse is diffuse. Carotid upstroke normal without bruit. Jugular venous pressure is normal.   LUNGS: Lungs have bibasilar crackles and decreased breath sounds.   ABDOMEN: Soft, nontender without hepatosplenomegaly or masses. Abdominal aorta is normal size without bruit.   EXTREMITIES: 2+ bilateral pulses in dorsal, pedal, radial, and femoral arteries with 1+ lower extremity edema. No cyanosis, clubbing, ulcers.   NEUROLOGIC: The patient is oriented to time, place, and person with normal mood and affect   ASSESSMENT: 81 year old male with aortic valve replacement, mitral insufficiency, severe shortness of breath, lung disease with fibrosis, hypertension, hyperlipidemia, intermittent atrial fibrillation now in normal sinus rhythm with congestive heart failure and hypoxia.   RECOMMENDATIONS:  1. No further cardiac diagnostics at this time Brett [name] to no evidence of myocardial infarction or other cardiac abnormalities. 2. Continue to follow for atrial fibrillation and further diagnostic testing as necessary Brett [name] to the fact that the patient is stable at this time.  3. Intravenous Lasix for further risk reduction and pulmonary edema and congestive heart failure.  4. Hypertension control without using beta blocker which may  exacerbate lung disease.  5. Further treatment options after above.  ____________________________ Corey Skains, MD bjk:cms D: 03/01/2012 07:44:00 ET T: 03/01/2012 08:10:53 ET JOB#: [id number]  cc: Corey Skains, MD, <Dictator> Corey Skains MD ELECTRONICALLY SIGNED 03/02/2012 8:51

## 2015-02-09 NOTE — Consult Note (Signed)
Chief Complaint:   Subjective/Chief Complaint he is feeling better since having started the revation. Less SOB and is able to move around with less SOB   VITAL SIGNS/ANCILLARY NOTES: **Vital Signs.:   16-May-13 05:05   Vital Signs Type Routine   Temperature Temperature (F) 98.5   Celsius 36.9   Temperature Source oral   Pulse Pulse 97   Pulse source per Dinamap   Respirations Respirations 22   Systolic BP Systolic BP 128   Diastolic BP (mmHg) Diastolic BP (mmHg) 62   Mean BP 75   BP Source Dinamap   Pulse Ox % Pulse Ox % 95   Pulse Ox Activity Level  At rest   Oxygen Delivery 6L  *Intake and Output.:   Daily 16-May-13 07:00   Grand Totals Intake:  480 Output:  800    Net:  -320 24 Hr.:  -320   Oral Intake      In:  480   Urine ml     Out:  800   Length of Stay Totals Intake:  1200 Output:  2675    Net:  -7867   Brief Assessment:   Cardiac Regular  -- LE edema  -- JVD  --Rub  --Gallop    Respiratory normal resp effort  no use of accessory muscles  crackles    Gastrointestinal details normal Soft  Nontender  Bowel sounds normal   Routine Hem:  14-May-13 03:01    WBC (CBC) 7.1   RBC (CBC) 3.71   Hemoglobin (CBC) 10.6   Hematocrit (CBC) 32.5   Platelet Count (CBC) 228   MCV 88   MCH 28.6   MCHC 32.6   RDW 18.4   Neutrophil % 71.2   Lymphocyte % 19.3   Monocyte % 6.0   Eosinophil % 3.0   Basophil % 0.5   Neutrophil # 5.0   Lymphocyte # 1.4   Monocyte # 0.4   Eosinophil # 0.2   Basophil # 0.0  Routine Chem:  14-May-13 03:01    Glucose, Serum 94   BUN 26   Creatinine (comp) 1.08   Sodium, Serum 142   Potassium, Serum 3.6   Chloride, Serum 100   CO2, Serum 36   Calcium (Total), Serum 8.1   Osmolality (calc) 288   eGFR (African American) >60   eGFR (Non-African American) >60   Anion Gap 6  Cardiac:  14-May-13 03:01    Troponin I 0.04   Assessment/Plan:  Assessment/Plan:   Assessment 33 yow male with ILD and PAH now doing better with having  started on the Revatio  -ILD-I spoke to Dr Lake Bells regarding therapy. He is in agreement with starting the revatio and I also suggested considering bosentan which can be started as an outpatient. Monitor PA pressures as an outpatient with f/u echo   Electronic Signatures: Allyne Gee (MD)  (Signed 16-May-13 08:52)  Authored: Chief Complaint, VITAL SIGNS/ANCILLARY NOTES, Brief Assessment, Lab Results, Assessment/Plan   Last Updated: 16-May-13 08:52 by Allyne Gee (MD)
